# Patient Record
Sex: Male | Born: 1940 | Race: White | Hispanic: No | Marital: Married | Smoking: Current every day smoker
Health system: Southern US, Community
[De-identification: ages and names within clinical notes are randomized; demographics above are authoritative.]

## PROBLEM LIST (undated history)

## (undated) DIAGNOSIS — J449 Chronic obstructive pulmonary disease, unspecified: Secondary | ICD-10-CM

## (undated) DIAGNOSIS — E119 Type 2 diabetes mellitus without complications: Secondary | ICD-10-CM

## (undated) DIAGNOSIS — G8929 Other chronic pain: Secondary | ICD-10-CM

## (undated) DIAGNOSIS — J439 Emphysema, unspecified: Secondary | ICD-10-CM

## (undated) HISTORY — PX: REPLACEMENT TOTAL KNEE: SUR1224

---

## 2003-02-08 ENCOUNTER — Other Ambulatory Visit: Payer: Self-pay

## 2005-01-21 ENCOUNTER — Ambulatory Visit: Payer: Self-pay | Admitting: Nurse Practitioner

## 2005-01-28 ENCOUNTER — Ambulatory Visit: Payer: Self-pay | Admitting: *Deleted

## 2005-02-14 ENCOUNTER — Ambulatory Visit: Payer: Self-pay | Admitting: Orthopedic Surgery

## 2006-03-04 ENCOUNTER — Ambulatory Visit: Payer: Self-pay | Admitting: *Deleted

## 2006-03-18 ENCOUNTER — Ambulatory Visit: Payer: Self-pay | Admitting: *Deleted

## 2007-09-19 IMAGING — CR DG CHEST 2V
1 series · 3 of 3 positions shown · non-contrast
Comparison: none

REASON FOR EXAM: xray chest copd call report [id number]
COMMENTS:

[Series 1: view not recorded · 0.17mm/px · 3 of 3 slices shown]
[im 1/3]
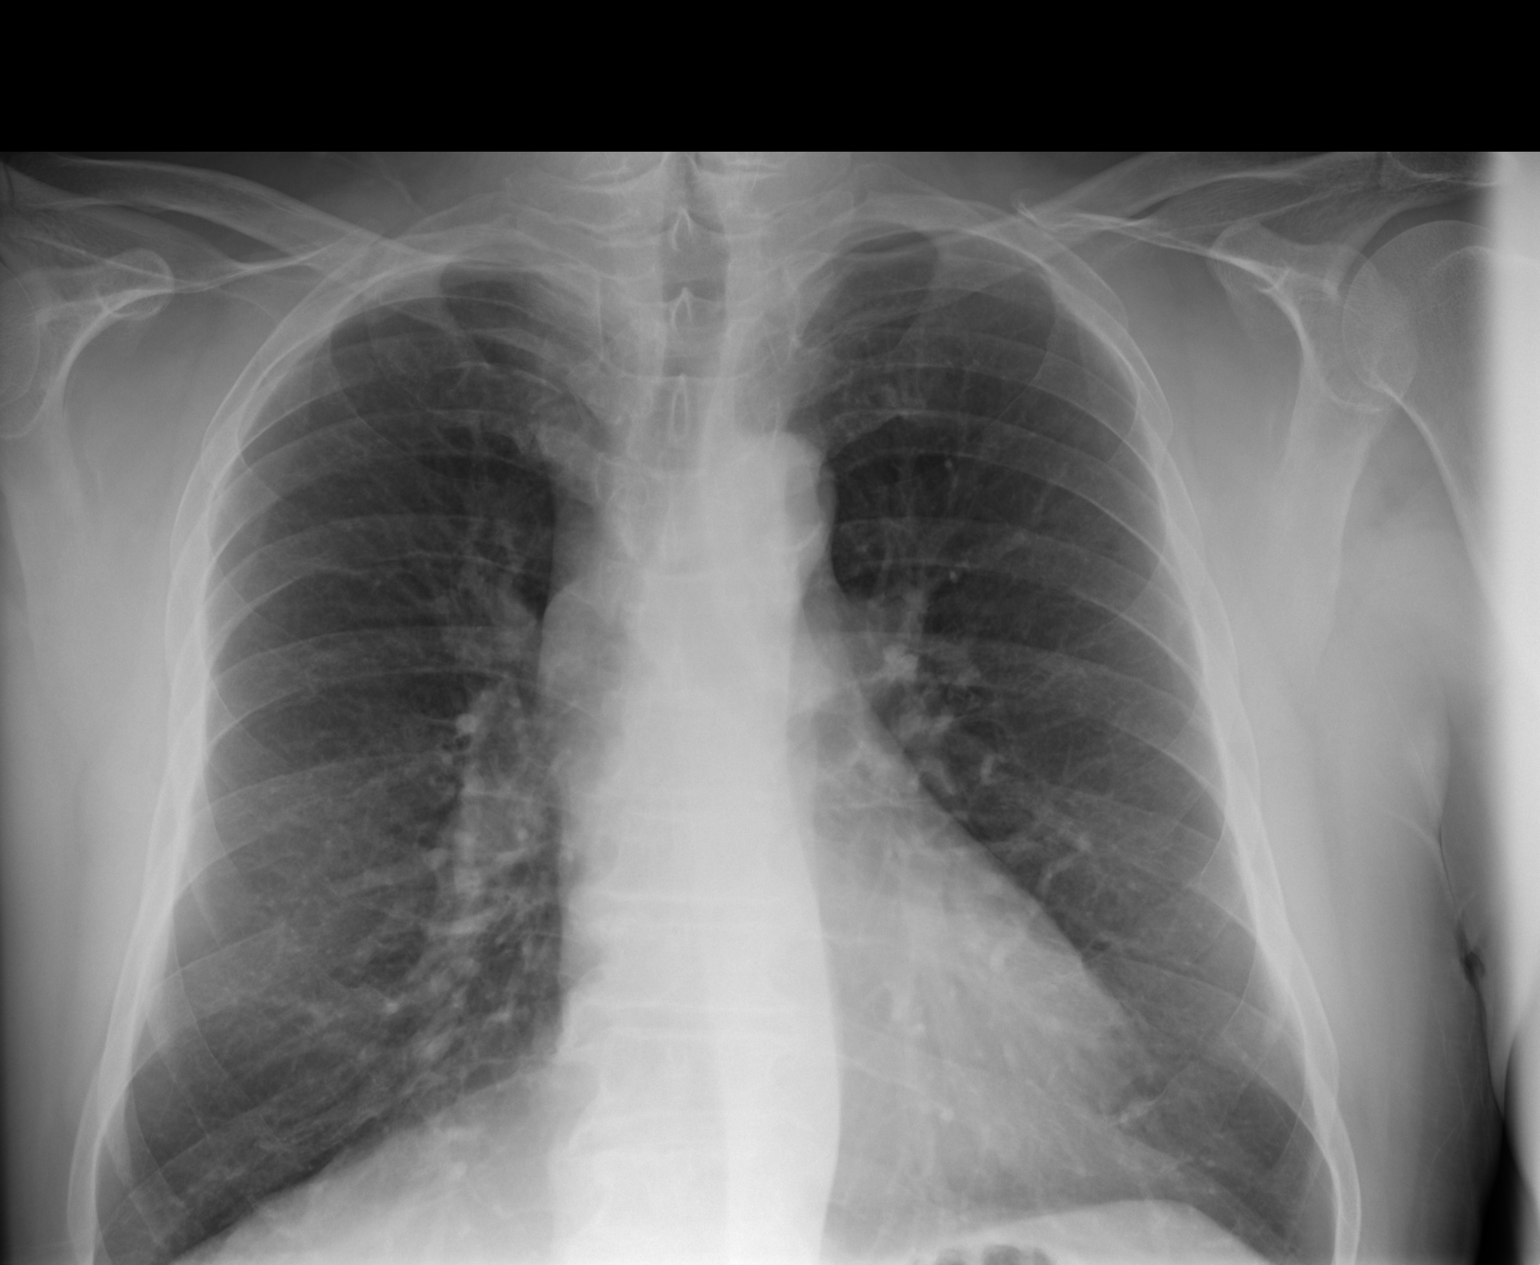
[im 2/3]
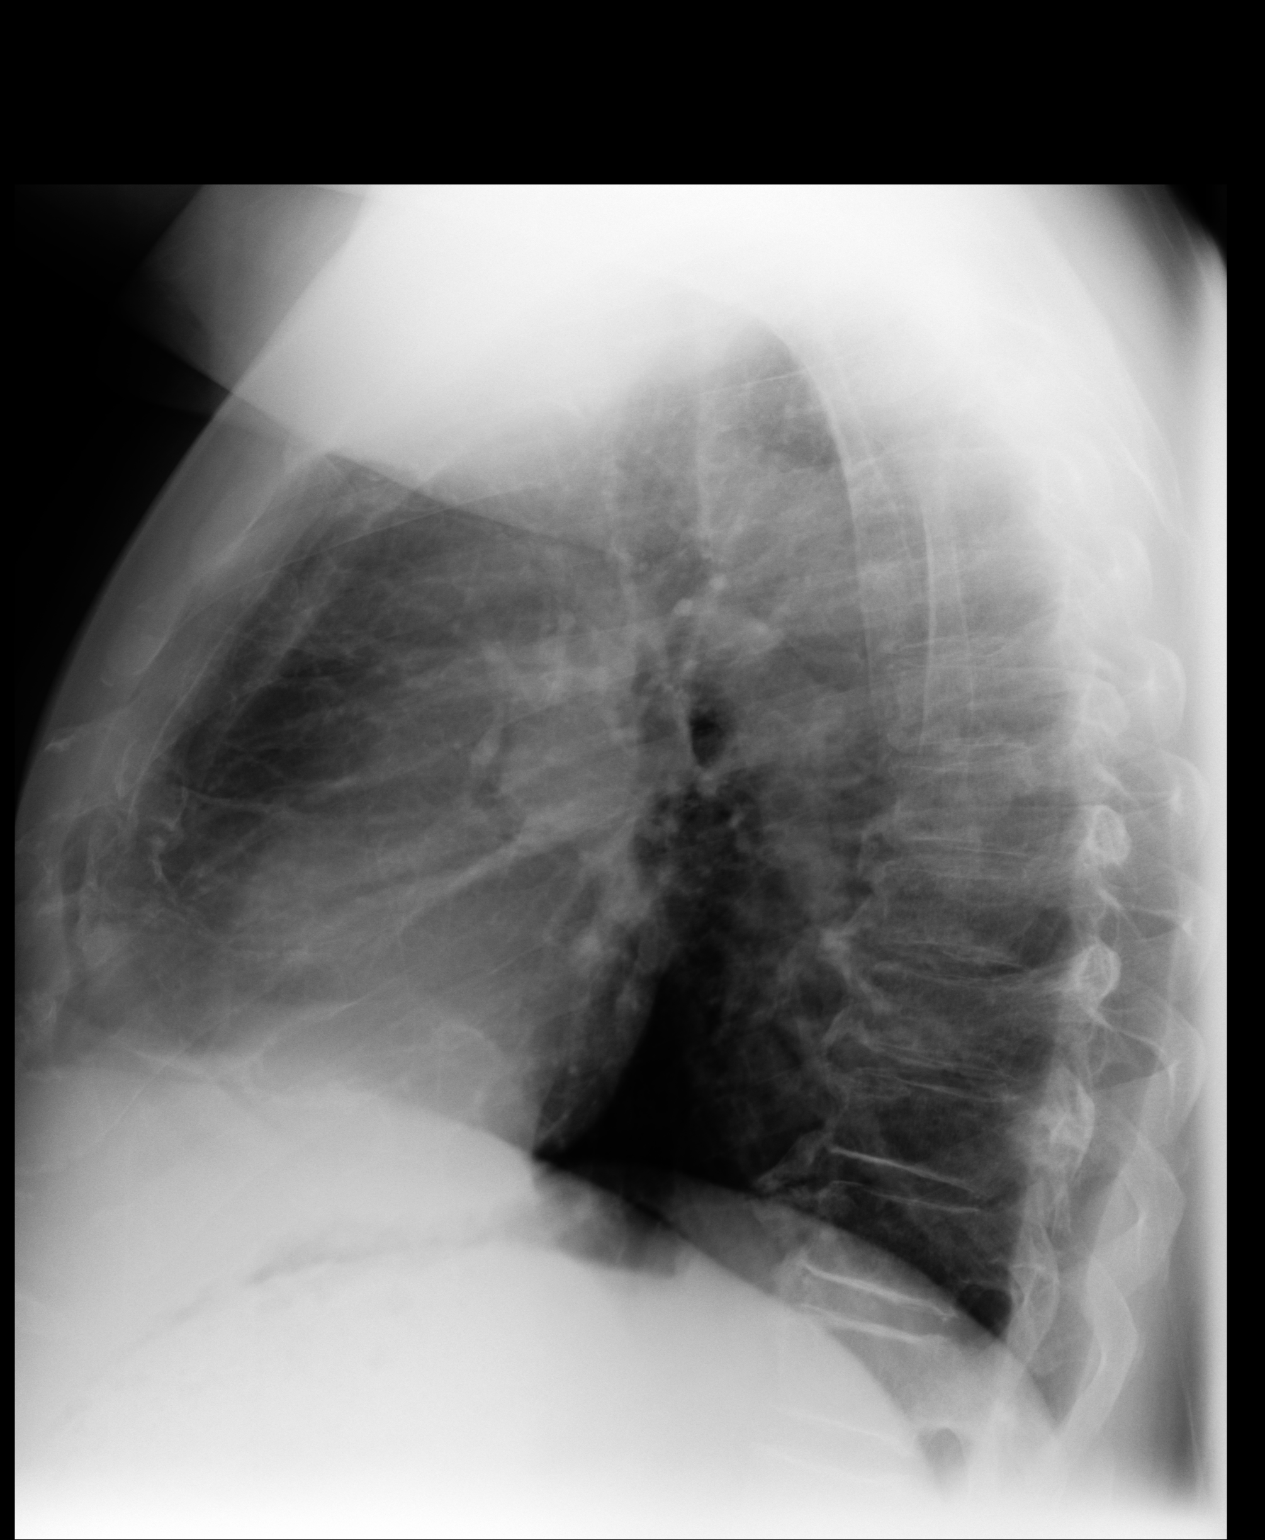
[im 3/3]
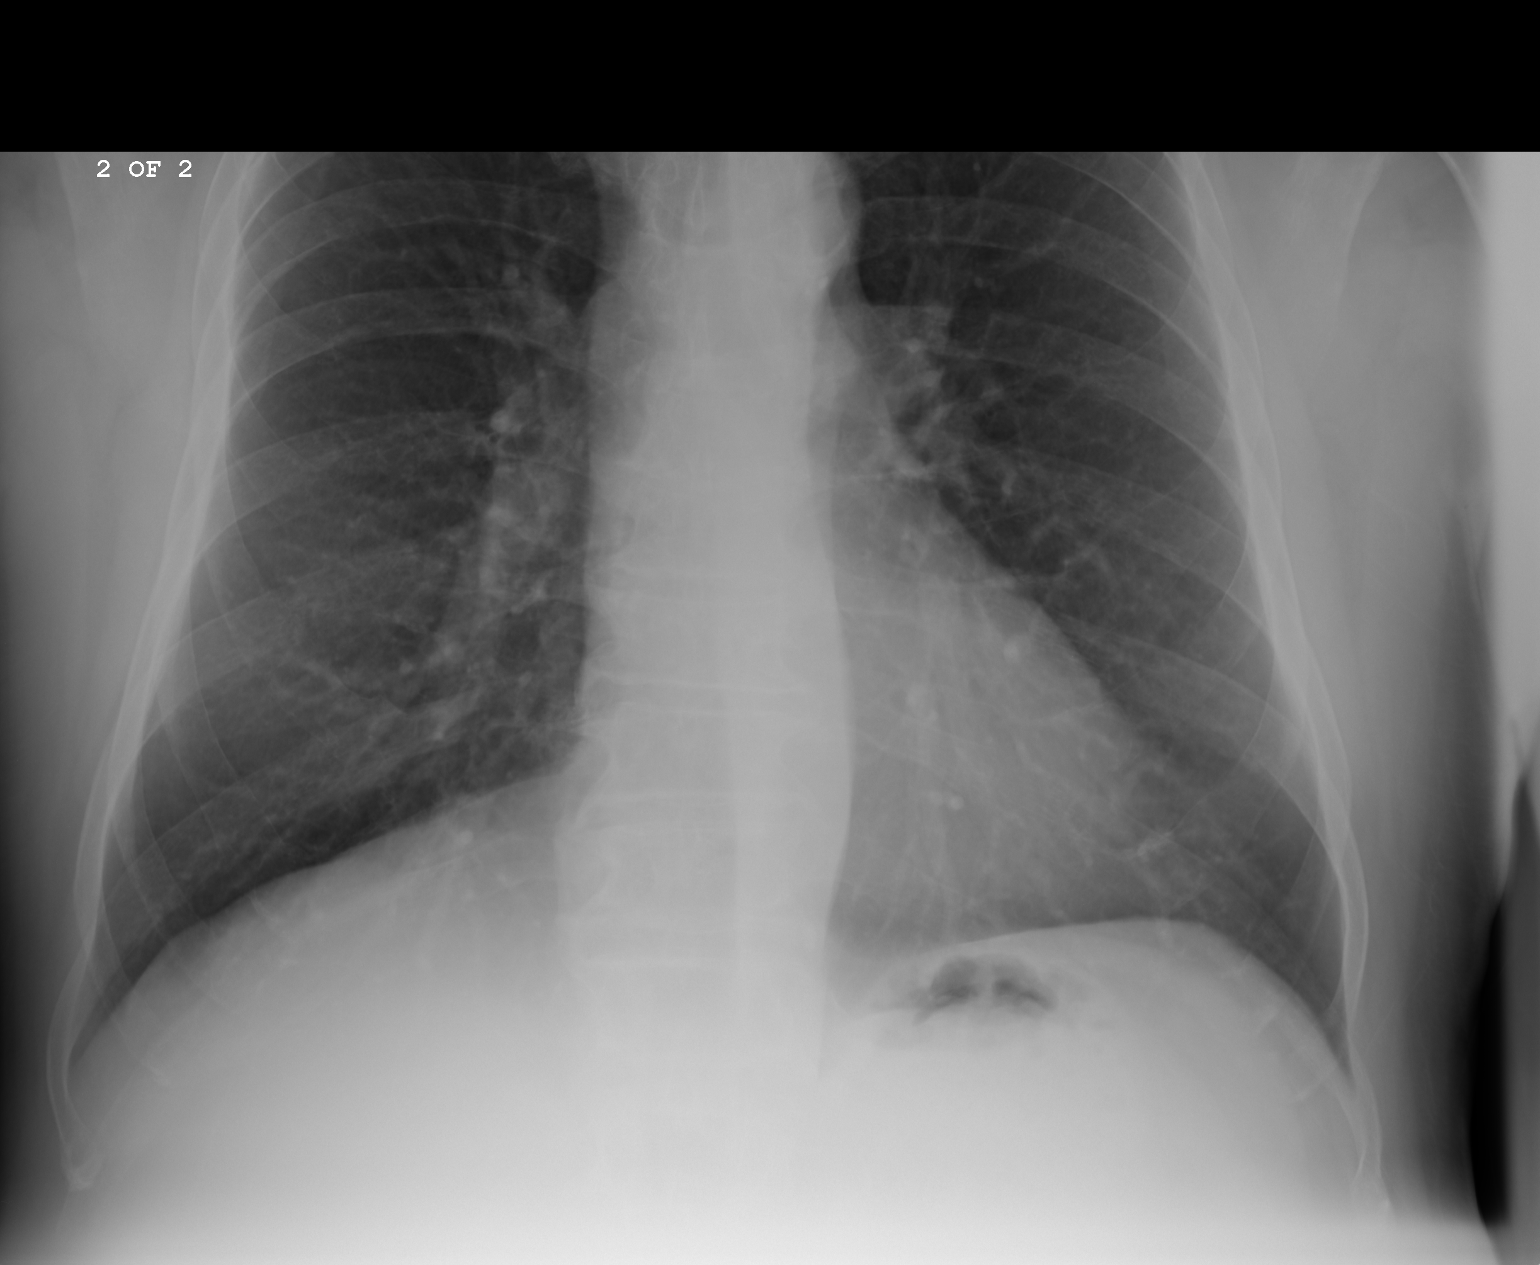

[3 of 3 positions shown; findings below may reference images not displayed]

PROCEDURE:     DXR - DXR CHEST PA (OR AP) AND LATERAL  - March 18, 2006  [DATE]

RESULT:     Comparison is made to study 28 January, 2005.

The lungs are adequately inflated. There is no focal infiltrate. The heart
is not enlarged and the pulmonary vascularity is not engorged. There are
mild degenerative changes of the thoracic spine.
IMPRESSION: There is mild hyperinflation consistent with COPD. This is
stable. I see no evidence of CHF or pneumonia.

## 2007-11-21 ENCOUNTER — Ambulatory Visit: Payer: Self-pay | Admitting: Family Medicine

## 2008-09-20 ENCOUNTER — Ambulatory Visit: Payer: Self-pay | Admitting: Gastroenterology

## 2011-03-18 ENCOUNTER — Emergency Department: Payer: Self-pay

## 2011-03-18 LAB — HEPATIC FUNCTION PANEL A (ARMC)
Albumin: 3.7 g/dL (ref 3.4–5.0)
Alkaline Phosphatase: 74 U/L (ref 50–136)
Bilirubin, Direct: 0.1 mg/dL (ref 0.00–0.20)
SGOT(AST): 17 U/L (ref 15–37)
SGPT (ALT): 23 U/L

## 2011-03-18 LAB — BASIC METABOLIC PANEL
Anion Gap: 7 (ref 7–16)
BUN: 18 mg/dL (ref 7–18)
Chloride: 96 mmol/L — ABNORMAL LOW (ref 98–107)
Creatinine: 1.02 mg/dL (ref 0.60–1.30)
Osmolality: 276 (ref 275–301)

## 2011-03-18 LAB — CBC
HGB: 12.8 g/dL — ABNORMAL LOW (ref 13.0–18.0)
MCH: 29.2 pg (ref 26.0–34.0)
MCHC: 32.1 g/dL (ref 32.0–36.0)
Platelet: 143 10*3/uL — ABNORMAL LOW (ref 150–440)
RDW: 14.2 % (ref 11.5–14.5)
WBC: 7 10*3/uL (ref 3.8–10.6)

## 2011-03-18 LAB — TROPONIN I: Troponin-I: <0.02 ng/mL

## 2013-09-22 ENCOUNTER — Emergency Department: Payer: Self-pay | Admitting: Emergency Medicine

## 2013-09-22 LAB — COMPREHENSIVE METABOLIC PANEL
ALK PHOS: 81 U/L
Albumin: 3.2 g/dL — ABNORMAL LOW (ref 3.4–5.0)
Anion Gap: 3 — ABNORMAL LOW (ref 7–16)
BUN: 14 mg/dL (ref 7–18)
Bilirubin,Total: 0.3 mg/dL (ref 0.2–1.0)
CALCIUM: 8.6 mg/dL (ref 8.5–10.1)
CHLORIDE: 99 mmol/L (ref 98–107)
CO2: 37 mmol/L — AB (ref 21–32)
CREATININE: 1.21 mg/dL (ref 0.60–1.30)
EGFR (African American): >60
GFR CALC NON AF AMER: 59 — AB
Glucose: 162 mg/dL — ABNORMAL HIGH (ref 65–99)
OSMOLALITY: 282 (ref 275–301)
Potassium: 4.5 mmol/L (ref 3.5–5.1)
SGOT(AST): 19 U/L (ref 15–37)
SGPT (ALT): 24 U/L
SODIUM: 139 mmol/L (ref 136–145)
TOTAL PROTEIN: 7.3 g/dL (ref 6.4–8.2)

## 2013-09-22 LAB — CBC
HCT: 33.2 % — ABNORMAL LOW (ref 40.0–52.0)
HGB: 10.1 g/dL — ABNORMAL LOW (ref 13.0–18.0)
MCH: 26.4 pg (ref 26.0–34.0)
MCHC: 30.5 g/dL — ABNORMAL LOW (ref 32.0–36.0)
MCV: 86 fL (ref 80–100)
Platelet: 153 10*3/uL (ref 150–440)
RBC: 3.84 10*6/uL — AB (ref 4.40–5.90)
RDW: 15.8 % — ABNORMAL HIGH (ref 11.5–14.5)
WBC: 6.9 10*3/uL (ref 3.8–10.6)

## 2013-09-22 LAB — TROPONIN I: Troponin-I: <0.02 ng/mL

## 2015-12-31 ENCOUNTER — Encounter: Payer: Medicare Other | Attending: Pulmonary Disease | Admitting: Respiratory Therapy

## 2015-12-31 VITALS — Ht 69.5 in | Wt 209.1 lb

## 2015-12-31 DIAGNOSIS — J432 Centrilobular emphysema: Secondary | ICD-10-CM | POA: Insufficient documentation

## 2015-12-31 NOTE — Patient Instructions (Signed)
Patient Instructions  Patient Details  Name: Jeremy PlanasJackie N Cadena Sr. MRN: [id number] Date of Birth: [birth year] Referring Provider:  Cathleen Cortihang, Lydia H, MD  Below are the personal goals you chose as well as exercise and nutrition goals. Our goal is to help you keep on track towards obtaining and maintaining your goals. We will be discussing your progress on these goals with you throughout the program.  Initial Exercise Prescription:     Initial Exercise Prescription - 12/31/15 1100      Date of Initial Exercise RX and Referring Provider   Date 12/31/15   Referring Provider Cherly Hensenhang     Oxygen   Oxygen Continuous   Liters 2     NuStep   Level 2   Minutes 15   METs 2     Arm Ergometer   Level 1   Minutes 15   METs 2     REL-XR   Level 2   Minutes 15   METs 2     T5 Nustep   Level 1   Minutes 15   METs 2     Biostep-RELP   Level 2   Minutes 15   METs 2     Intensity   THRR 40-80% of Max Heartrate 117-136   Ratings of Perceived Exertion 11-13   Perceived Dyspnea 0-4     Progression   Progression Continue to progress workloads to maintain intensity without signs/symptoms of physical distress.     Resistance Training   Training Prescription Yes   Weight 3   Reps 10-12      Exercise Goals: Frequency: Be able to perform aerobic exercise three times per week working toward 3-5 days per week.  Intensity: Work with a perceived exertion of 11 (fairly light) - 15 (hard) as tolerated. Follow your new exercise prescription and watch for changes in prescription as you progress with the program. Changes will be reviewed with you when they are made.  Duration: You should be able to do 30 minutes of continuous aerobic exercise in addition to a 5 minute warm-up and a 5 minute cool-down routine.  Nutrition Goals: Your personal nutrition goals will be established when you do your nutrition analysis with the dietician.  The following are nutrition guidelines to  follow: Cholesterol < 200mg /day Sodium < 1500mg /day Fiber: Men over 50 yrs - 30 grams per day  Personal Goals:     Personal Goals and Risk Factors at Admission - 12/31/15 1333      Core Components/Risk Factors/Patient Goals on Admission   Sedentary Yes   Intervention Provide advice, education, support and counseling about physical activity/exercise needs.;Develop an individualized exercise prescription for aerobic and resistive training based on initial evaluation findings, risk stratification, comorbidities and participant's personal goals.   Expected Outcomes Achievement of increased cardiorespiratory fitness and enhanced flexibility, muscular endurance and strength shown through measurements of functional capacity and personal statement of participant.   Increase Strength and Stamina Yes   Intervention Provide advice, education, support and counseling about physical activity/exercise needs.;Develop an individualized exercise prescription for aerobic and resistive training based on initial evaluation findings, risk stratification, comorbidities and participant's personal goals.   Expected Outcomes Achievement of increased cardiorespiratory fitness and enhanced flexibility, muscular endurance and strength shown through measurements of functional capacity and personal statement of participant.   Improve shortness of breath with ADL's Yes   Intervention Provide education, individualized exercise plan and daily activity instruction to help decrease symptoms of SOB with activities of daily living.  Expected Outcomes Short Term: Achieves a reduction of symptoms when performing activities of daily living.   Develop more efficient breathing techniques such as purse lipped breathing and diaphragmatic breathing; and practicing self-pacing with activity Yes   Intervention Provide education, demonstration and support about specific breathing techniuqes utilized for more efficient breathing. Include  techniques such as pursed lipped breathing, diaphragmatic breathing and self-pacing activity.   Expected Outcomes Short Term: Participant will be able to demonstrate and use breathing techniques as needed throughout daily activities.   Increase knowledge of respiratory medications and ability to use respiratory devices properly  Yes   Intervention Provide education and demonstration as needed of appropriate use of medications, inhalers, and oxygen therapy.   Expected Outcomes Short Term: Achieves understanding of medications use. Understands that oxygen is a medication prescribed by physician. Demonstrates appropriate use of inhaler and oxygen therapy.   Diabetes Yes   Intervention Provide education about signs/symptoms and action to take for hypo/hyperglycemia.;Provide education about proper nutrition, including hydration, and aerobic/resistive exercise prescription along with prescribed medications to achieve blood glucose in normal ranges: Fasting glucose 65-99 mg/dL   Expected Outcomes Short Term: Participant verbalizes understanding of the signs/symptoms and immediate care of hyper/hypoglycemia, proper foot care and importance of medication, aerobic/resistive exercise and nutrition plan for blood glucose control.;Long Term: Attainment of HbA1C < 7%.      Tobacco Use Initial Evaluation: History  Smoking Status   Not on file  Smokeless Tobacco   Not on file    Copy of goals given to participant.

## 2015-12-31 NOTE — Progress Notes (Signed)
Pulmonary Individual Treatment Plan  Patient Details  Name: Jeremy PlanasJackie N Omura Sr. MRN: [id number] Date of Birth: 11/11/1940 Referring Provider:   Flowsheet Row Pulmonary [facility]  Referring Provider  Cherly Hensenhang      Initial Encounter Date:  Flowsheet Row [facility]  Date  12/31/15  Referring Provider  Cherly Hensenhang      Visit Diagnosis: Centrilobular emphysema (HCC)  Patient's Home Medications on Admission: No current outpatient prescriptions on file.  Past Medical History: No past medical history on file.  Tobacco Use: History  Smoking Status   Not on file  Smokeless Tobacco   Not on file    Labs: Recent Review Flowsheet Data    There is no flowsheet data to display.       ADL UCSD:     Pulmonary Assessment Scores    Row Name 12/31/15 1330         ADL UCSD   ADL Phase Entry     SOB Score total 93     Rest 3     Walk 5     Stairs 5     Bath 3     Dress 3     Shop 5        Pulmonary Function Assessment:     Pulmonary Function Assessment - 12/31/15 1329      Initial Spirometry Results   FVC% 34 %   FEV1% 26 %   FEV1/FVC Ratio 55.82   Comments Test date: 12/31/15     Post Bronchodilator Spirometry Results   FVC% 42 %   FEV1% 36 %   FEV1/FVC Ratio 56.95     Breath   Bilateral Breath Sounds Decreased;Wheezes;Expiratory   Shortness of Breath Yes;Limiting activity;Fear of Shortness of Breath      Exercise Target Goals: Date: 12/31/15  Exercise Program Goal: Individual exercise prescription set with THRR, safety & activity barriers. Participant demonstrates ability to understand and report RPE using BORG scale, to self-measure pulse accurately, and to acknowledge the importance of the exercise prescription.  Exercise Prescription Goal: Starting with aerobic activity 30 plus minutes a day, 3 days per week for initial exercise prescription.  Provide home exercise prescription and guidelines that participant acknowledges understanding prior to discharge.  Activity Barriers & Risk Stratification:     Activity Barriers & Cardiac Risk Stratification - 12/31/15 1329      Activity Barriers & Cardiac Risk Stratification   Activity Barriers Back Problems;Shortness of Breath;Deconditioning;Muscular Weakness      6 Minute Walk:     6 Minute Walk    Row Name 12/31/15 1146         6 Minute Walk   Distance 355 feet     Walk Time 2.5 minutes     # of Rest Breaks 1     MPH 1.6     METS 2.23     RPE 13     Perceived Dyspnea  5     VO2 Peak 4.36     Symptoms Yes (comment)     Comments stopped due to back pain 8 out of 10     Resting HR 99 bpm     Resting BP 130/72     Max Ex. HR 116 bpm     Max Ex. BP 154/58     2 Minute Post BP 118/58       Interval HR   Baseline HR 99  1 Minute HR 106     2 Minute HR 116     2 Minute Post HR 95     Interval Heart Rate? Yes       Interval Oxygen   Interval Oxygen? Yes     Baseline Oxygen Saturation % 94 %     Baseline Liters of Oxygen 2 L     1 Minute Oxygen Saturation % 92 %     1 Minute Liters of Oxygen 2 L     2 Minute Oxygen Saturation % 92 %     2 Minute Liters of Oxygen 2 L     2 Minute Post Oxygen Saturation % 94 %     2 Minute Post Liters of Oxygen 2 L        Initial Exercise Prescription:     Initial Exercise Prescription - 12/31/15 1100      Date of Initial Exercise RX and Referring Provider   Date 12/31/15   Referring Provider Cherly Hensenhang     Oxygen   Oxygen Continuous   Liters 2     NuStep   Level 2   Minutes 15   METs 2     Arm Ergometer   Level 1   Minutes 15   METs 2     REL-XR   Level 2   Minutes 15   METs 2     T5 Nustep   Level 1   Minutes 15   METs 2     Biostep-RELP   Level 2   Minutes 15   METs 2     Intensity   THRR 40-80% of Max Heartrate 117-136   Ratings of Perceived Exertion 11-13   Perceived Dyspnea 0-4      Progression   Progression Continue to progress workloads to maintain intensity without signs/symptoms of physical distress.     Resistance Training   Training Prescription Yes   Weight 3   Reps 10-12      Perform Capillary Blood Glucose checks as needed.  Exercise Prescription Changes:   Exercise Comments:   Discharge Exercise Prescription (Final Exercise Prescription Changes):    Nutrition:  Target Goals: Understanding of nutrition guidelines, daily intake of sodium 1500mg , cholesterol 200mg , calories 30% from fat and 7% or less from saturated fats, daily to have 5 or more servings of fruits and vegetables.  Biometrics:     Pre Biometrics - 12/31/15 1145      Pre Biometrics   Height 5' 9.5" (1.765 m)   Weight 209 lb 1.6 oz (94.8 kg)   Waist Circumference 46.25 inches   Hip Circumference 41.75 inches   Waist to Hip Ratio 1.11 %   BMI (Calculated) 30.5       Nutrition Therapy Plan and Nutrition Goals:   Nutrition Discharge: Rate Your Plate Scores:   Psychosocial: Target Goals: Acknowledge presence or absence of depression, maximize coping skills, provide positive support system. Participant is able to verbalize types and ability to use techniques and skills needed for reducing stress and depression.  Initial Review & Psychosocial Screening:     Initial Psych Review & Screening - 12/31/15 1334      Family Dynamics   Good Support System? Yes   Comments Mr Manson PasseyBrown has a very supportive family, including his wife and 3 children. His children live close and help with many chores at Mr Brown's home. He is looking forward to more activity and possibly return to fishing and hunting.  Barriers   Psychosocial barriers to participate in program The patient should benefit from training in stress management and relaxation.     Screening Interventions   Interventions Encouraged to exercise;Program counselor consult      Quality of Life Scores:     Quality of  Life - 12/31/15 1335      Quality of Life Scores   Health/Function Pre 19.63 %   Socioeconomic Pre 19.13 %   Psych/Spiritual Pre 20.67 %   Family Pre 21 %   GLOBAL Pre 19.89 %      PHQ-9: Recent Review Flowsheet Data    Depression screen [facility] 2/9 12/31/2015   Decreased Interest 1   Down, Depressed, Hopeless 1   PHQ - 2 Score 2   Altered sleeping 3   Tired, decreased energy 3   Change in appetite 1   Feeling bad or failure about yourself  0   Trouble concentrating 1   Moving slowly or fidgety/restless 0   Suicidal thoughts 0   PHQ-9 Score 10   Difficult doing work/chores Very difficult      Psychosocial Evaluation and Intervention:   Psychosocial Re-Evaluation:  Education: Education Goals: Education classes will be provided on a weekly basis, covering required topics. Participant will state understanding/return demonstration of topics presented.  Learning Barriers/Preferences:     Learning Barriers/Preferences - 12/31/15 1329      Learning Barriers/Preferences   Learning Barriers None   Learning Preferences None      Education Topics: Initial Evaluation Education: - Verbal, written and demonstration of respiratory meds, RPE/PD scales, oximetry and breathing techniques. Instruction on use of nebulizers and MDIs: cleaning and proper use, rinsing mouth with steroid doses and importance of monitoring MDI activations. Flowsheet Row Pulmonary Rehab from 12/31/2015 in [facility] Cardiac and Pulmonary Rehab  Date  12/31/15  Educator  LB  Instruction Review Code  2- meets goals/outcomes      General Nutrition Guidelines/Fats and Fiber: -Group instruction provided by verbal, written material, models and posters to present the general guidelines for heart healthy nutrition. Gives an explanation and review of dietary fats and fiber.   Controlling Sodium/Reading Food Labels: -Group verbal and written material supporting the discussion of sodium use in heart healthy  nutrition. Review and explanation with models, verbal and written materials for utilization of the food label.   Exercise Physiology & Risk Factors: - Group verbal and written instruction with models to review the exercise physiology of the cardiovascular system and associated critical values. Details cardiovascular disease risk factors and the goals associated with each risk factor.   Aerobic Exercise & Resistance Training: - Gives group verbal and written discussion on the health impact of inactivity. On the components of aerobic and resistive training programs and the benefits of this training and how to safely progress through these programs.   Flexibility, Balance, General Exercise Guidelines: - Provides group verbal and written instruction on the benefits of flexibility and balance training programs. Provides general exercise guidelines with specific guidelines to those with heart or lung disease. Demonstration and skill practice provided.   Stress Management: - Provides group verbal and written instruction about the health risks of elevated stress, cause of high stress, and healthy ways to reduce stress.   Depression: - Provides group verbal and written instruction on the correlation between heart/lung disease and depressed mood, treatment options, and the stigmas associated with seeking treatment.   Exercise & Equipment Safety: - Individual verbal instruction and demonstration of equipment use and safety  with use of the equipment.   Infection Prevention: - Provides verbal and written material to individual with discussion of infection control including proper hand washing and proper equipment cleaning during exercise session. Flowsheet Row Pulmonary Rehab from 12/31/2015 in [facility] Cardiac and Pulmonary Rehab  Date  12/31/15  Educator  LB  Instruction Review Code  2- meets goals/outcomes      Falls Prevention: - Provides verbal and written material to individual with  discussion of falls prevention and safety. Flowsheet Row Pulmonary Rehab from 12/31/2015 in [facility] Cardiac and Pulmonary Rehab  Date  12/31/15  Educator  LB  Instruction Review Code  2- meets goals/outcomes      Diabetes: - Individual verbal and written instruction to review signs/symptoms of diabetes, desired ranges of glucose level fasting, after meals and with exercise. Advice that pre and post exercise glucose checks will be done for 3 sessions at entry of program.   Chronic Lung Diseases: - Group verbal and written instruction to review new updates, new respiratory medications, new advancements in procedures and treatments. Provide informative websites and "800" numbers of self-education.   Lung Procedures: - Group verbal and written instruction to describe testing methods done to diagnose lung disease. Review the outcome of test results. Describe the treatment choices: Pulmonary Function Tests, ABGs and oximetry.   Energy Conservation: - Provide group verbal and written instruction for methods to conserve energy, plan and organize activities. Instruct on pacing techniques, use of adaptive equipment and posture/positioning to relieve shortness of breath.   Triggers: - Group verbal and written instruction to review types of environmental controls: home humidity, furnaces, filters, dust mite/pet prevention, HEPA vacuums. To discuss weather changes, air quality and the benefits of nasal washing.   Exacerbations: - Group verbal and written instruction to provide: warning signs, infection symptoms, calling MD promptly, preventive modes, and value of vaccinations. Review: effective airway clearance, coughing and/or vibration techniques. Create an Sport and exercise psychologist.   Oxygen: - Individual and group verbal and written instruction on oxygen therapy. Includes supplement oxygen, available portable oxygen systems, continuous and intermittent flow rates, oxygen safety, concentrators, and Medicare  reimbursement for oxygen. Flowsheet Row Pulmonary Rehab from 12/31/2015 in [facility] Cardiac and Pulmonary Rehab  Date  12/31/15  Educator  LB  Instruction Review Code  2- meets goals/outcomes      Respiratory Medications: - Group verbal and written instruction to review medications for lung disease. Drug class, frequency, complications, importance of spacers, rinsing mouth after steroid MDI's, and proper cleaning methods for nebulizers. Flowsheet Row Pulmonary Rehab from 12/31/2015 in [facility] Cardiac and Pulmonary Rehab  Date  12/31/15  Educator  LB  Instruction Review Code  2- meets goals/outcomes      AED/CPR: - Group verbal and written instruction with the use of models to demonstrate the basic use of the AED with the basic ABC's of resuscitation.   Breathing Retraining: - Provides individuals verbal and written instruction on purpose, frequency, and proper technique of diaphragmatic breathing and pursed-lipped breathing. Applies individual practice skills. Flowsheet Row Pulmonary Rehab from 12/31/2015 in [facility] Cardiac and Pulmonary Rehab  Date  12/31/15  Educator  LB  Instruction Review Code  2- meets goals/outcomes      Anatomy and Physiology of the Lungs: - Group verbal and written instruction with the use of models to provide basic lung anatomy and physiology related to function, structure and complications of lung disease.   Heart Failure: - Group verbal and written instruction on the basics of heart failure:  signs/symptoms, treatments, explanation of ejection fraction, enlarged heart and cardiomyopathy.   Sleep Apnea: - Individual verbal and written instruction to review Obstructive Sleep Apnea. Review of risk factors, methods for diagnosing and types of masks and machines for OSA.   Anxiety: - Provides group, verbal and written instruction on the correlation between heart/lung disease and anxiety, treatment options, and management of anxiety.   Relaxation: - Provides  group, verbal and written instruction about the benefits of relaxation for patients with heart/lung disease. Also provides patients with examples of relaxation techniques.   Knowledge Questionnaire Score:     Knowledge Questionnaire Score - 12/31/15 1329      Knowledge Questionnaire Score   Pre Score 5/10       Core Components/Risk Factors/Patient Goals at Admission:     Personal Goals and Risk Factors at Admission - 12/31/15 1333      Core Components/Risk Factors/Patient Goals on Admission   Sedentary Yes   Intervention Provide advice, education, support and counseling about physical activity/exercise needs.;Develop an individualized exercise prescription for aerobic and resistive training based on initial evaluation findings, risk stratification, comorbidities and participant's personal goals.   Expected Outcomes Achievement of increased cardiorespiratory fitness and enhanced flexibility, muscular endurance and strength shown through measurements of functional capacity and personal statement of participant.   Increase Strength and Stamina Yes   Intervention Provide advice, education, support and counseling about physical activity/exercise needs.;Develop an individualized exercise prescription for aerobic and resistive training based on initial evaluation findings, risk stratification, comorbidities and participant's personal goals.   Expected Outcomes Achievement of increased cardiorespiratory fitness and enhanced flexibility, muscular endurance and strength shown through measurements of functional capacity and personal statement of participant.   Improve shortness of breath with ADL's Yes   Intervention Provide education, individualized exercise plan and daily activity instruction to help decrease symptoms of SOB with activities of daily living.   Expected Outcomes Short Term: Achieves a reduction of symptoms when performing activities of daily living.   Develop more efficient  breathing techniques such as purse lipped breathing and diaphragmatic breathing; and practicing self-pacing with activity Yes   Intervention Provide education, demonstration and support about specific breathing techniuqes utilized for more efficient breathing. Include techniques such as pursed lipped breathing, diaphragmatic breathing and self-pacing activity.   Expected Outcomes Short Term: Participant will be able to demonstrate and use breathing techniques as needed throughout daily activities.   Increase knowledge of respiratory medications and ability to use respiratory devices properly  Yes   Intervention Provide education and demonstration as needed of appropriate use of medications, inhalers, and oxygen therapy.   Expected Outcomes Short Term: Achieves understanding of medications use. Understands that oxygen is a medication prescribed by physician. Demonstrates appropriate use of inhaler and oxygen therapy.   Diabetes Yes   Intervention Provide education about signs/symptoms and action to take for hypo/hyperglycemia.;Provide education about proper nutrition, including hydration, and aerobic/resistive exercise prescription along with prescribed medications to achieve blood glucose in normal ranges: Fasting glucose 65-99 mg/dL   Expected Outcomes Short Term: Participant verbalizes understanding of the signs/symptoms and immediate care of hyper/hypoglycemia, proper foot care and importance of medication, aerobic/resistive exercise and nutrition plan for blood glucose control.;Long Term: Attainment of HbA1C < 7%.      Core Components/Risk Factors/Patient Goals Review:    Core Components/Risk Factors/Patient Goals at Discharge (Final Review):    ITP Comments:   Comments:  Mr Micalizzi will start [facility] on 01/06/16 and plans to attend 2-3 days/week.

## 2016-01-06 ENCOUNTER — Encounter: Payer: Medicare Other | Attending: Pulmonary Disease

## 2016-01-06 DIAGNOSIS — J432 Centrilobular emphysema: Secondary | ICD-10-CM | POA: Diagnosis not present

## 2016-01-06 LAB — GLUCOSE, CAPILLARY: GLUCOSE-CAPILLARY: 247 mg/dL — AB (ref 65–99)

## 2016-01-06 NOTE — Progress Notes (Signed)
Daily Session Note  Patient Details  Name: Jeremy WALTMAN Sr. MRN: [id number] Date of Birth: August 02, 1940 Referring Provider:   Flowsheet Row Pulmonary Rehab from 12/31/2015 in [facility] Card[facility] and [facility] Re[facility]  Referring Provider  Radene Knee      Encounter Date: 01/06/2016  Check In:     Session Check In - 01/06/16 1435      Check-In   Location [facility]-Cardiac & Pulmonary Rehab   Staff Present Carson Myrtle, BS, RRT, Respiratory Therapist;Kelly Amedeo Plenty, BS, ACSM CEP, Exercise Physiologist;Charmian Forbis Oletta Darter, BA, ACSM CEP, Exercise Physiologist   Supervising physician immediately available to respond to emergencies [facility] immediately available ER MD   Physician(s) Jimmye Norman and Mariea Clonts   Medication changes reported     No   Fall or balance concerns reported    No   Warm-up and Cool-down Performed as group-led instruction   Resistance Training Performed Yes   VAD Patient? No     Pain Assessment   Currently in Pain? No/denies           Exercise Prescription Changes - 01/06/16 1400      Response to Exercise   Blood Pressure (Admit) 110/60   Blood Pressure (Exercise) 128/74   Blood Pressure (Exit) 100/60   Heart Rate (Admit) 98 bpm   Heart Rate (Exercise) 103 bpm   Heart Rate (Exit) 94 bpm   Oxygen Saturation (Admit) 90 %   Oxygen Saturation (Exercise) 95 %   Oxygen Saturation (Exit) 97 %   Rating of Perceived Exertion (Exercise) 13   Perceived Dyspnea (Exercise) 4   Symptoms fatigue   Comments --  Omitted last exercise due to fatigue   Duration Progress to 45 minutes of aerobic exercise without signs/symptoms of physical distress   Intensity THRR unchanged     Progression   Progression Continue to progress workloads to maintain intensity without signs/symptoms of physical distress.     Resistance Training   Training Prescription Yes   Weight 3   Reps 10-12     Interval Training   Interval Training No     Oxygen   Oxygen Continuous   Liters 2     Arm Ergometer   Level 1   Minutes 15  5/5   METs 1.7     T5 Nustep   Level 2   Minutes 3      Goals Met:  Proper associated with RPD/PD & O2 Sat Strength training completed today  Goals Unmet:  Not Applicable  Comments: [name] left early due to fatigue.  His vitals during exercise were within normal limits.  He is very deconditioned and staff will continue to work with him to increase endurance.   Dr. Emily Filbert is Medical Director for [facility] and [facility].

## 2016-01-08 ENCOUNTER — Encounter: Payer: Medicare Other | Admitting: *Deleted

## 2016-01-08 DIAGNOSIS — J432 Centrilobular emphysema: Secondary | ICD-10-CM

## 2016-01-08 LAB — GLUCOSE, CAPILLARY: GLUCOSE-CAPILLARY: 204 mg/dL — AB (ref 65–99)

## 2016-01-08 NOTE — Progress Notes (Signed)
Incomplete Session Note  Patient Details  Name: Jeremy PlanasJackie N Reth Sr. MRN: [id number] Date of Birth: [birth year] Referring Provider:   [facility]  Referring Provider  Durward Forteshang      Jeremy N Grygiel Sr. did not complete his rehab session.  Pt arrived complaining of on going nausea on a daily basis.  His blood sugar was 205.  After talking with RRT, [name] decided to go home and call the doctor today.

## 2016-01-08 NOTE — Progress Notes (Deleted)
Daily Session Note  Patient Details  Name: Jeremy N Parlow Sr. MRN: [id number] Date of Birth: 08/04/1940 Referring Provider:   Flowsheet Row Pulmonary Rehab from 12/31/2015 in [facility] Cardiac and Pulmonary Rehab  Referring Provider  Chang      Encounter Date: 01/08/2016  Check In:     Session Check In - 01/08/16 1140      Check-In   Location [facility]-Cardiac & Pulmonary Rehab   Staff Present Jessica Hawkins, MA, ACSM RCEP, Exercise Physiologist;Other;Laureen Brown, BS, RRT, Respiratory Therapist   Supervising physician immediately available to respond to emergencies [facility] immediately available ER MD   Physician(s) Drs. McShane and Schaevitz   Medication changes reported     No   Fall or balance concerns reported    No   Warm-up and Cool-down Not performed (comment)  sent home see note   Resistance Training Performed No   VAD Patient? No     Pain Assessment   Currently in Pain? No/denies   Multiple Pain Sites No         Goals Met:  Proper associated with RPD/PD & O2 Sat Independence with exercise equipment Using PLB without cueing & demonstrates good technique Exercise tolerated well No report of cardiac concerns or symptoms Strength training completed today  Goals Unmet:  Not Applicable  Comments: Pt able to follow exercise prescription today without complaint.  Will continue to monitor for progression.    Dr. Mark Miller is Medical Director for [facility] and [facility]. 

## 2016-01-13 DIAGNOSIS — J432 Centrilobular emphysema: Secondary | ICD-10-CM | POA: Diagnosis not present

## 2016-01-13 LAB — GLUCOSE, CAPILLARY
GLUCOSE-CAPILLARY: 186 mg/dL — AB (ref 65–99)
GLUCOSE-CAPILLARY: 216 mg/dL — AB (ref 65–99)

## 2016-01-13 NOTE — Progress Notes (Signed)
Daily Session Note  Patient Details  Name: Jeremy Wells. MRN: [id number] Date of Birth: 03-17-1940 Referring Provider:   Flowsheet Row Pulmonary Rehab from 12/31/2015 in [facility] Card[facility] Pul[facility] Re[facility]  Referring Provider  Radene Knee      Encounter Date: 01/13/2016  Check In:     Session Check In - 01/13/16 1314      Check-In   Location [facility]-Cardiac & Pulmonary Rehab   Staff Present Carson Myrtle, BS, RRT, Respiratory Therapist;Kelly Amedeo Plenty, BS, ACSM CEP, Exercise Physiologist;Fatemah Pourciau Oletta Darter, BA, ACSM CEP, Exercise Physiologist   Supervising physician immediately available to respond to emergencies [facility] immediately available ER MD   Physician(s) Clearnce Hasten and Joni Fears   Medication changes reported     No   Fall or balance concerns reported    No   Warm-up and Cool-down Performed as group-led instruction   Resistance Training Performed Yes   VAD Patient? No     Pain Assessment   Currently in Pain? No/denies         Goals Met:  Proper associated with RPD/PD & O2 Sat Independence with exercise equipment Exercise tolerated well Strength training completed today  Goals Unmet:  Not Applicable  Comments: Pt able to follow exercise prescription today without complaint.  Will continue to monitor for progression.    Dr. Emily Filbert is Medical Director for [facility] and [facility].

## 2016-01-15 ENCOUNTER — Encounter: Payer: Self-pay | Admitting: Respiratory Therapy

## 2016-01-20 ENCOUNTER — Encounter: Payer: Self-pay | Admitting: Respiratory Therapy

## 2016-01-20 ENCOUNTER — Telehealth: Payer: Self-pay | Admitting: Respiratory Therapy

## 2016-01-20 DIAGNOSIS — J432 Centrilobular emphysema: Secondary | ICD-10-CM

## 2016-01-20 NOTE — Progress Notes (Signed)
Pulmonary Individual Treatment Plan  Patient Details  Name: Jeremy PlanasJackie N Scheibe Sr. MRN: [id number] Date of Birth: 21-Apr-1940 Referring Provider:   Flowsheet Row Pulmonary Rehab from 12/31/2015 in [facility] Cardiac and Pulmonary Rehab  Referring Provider  Cherly Hensenhang      Initial Encounter Date:  Flowsheet Row Pulmonary Rehab from 12/31/2015 in [facility] Cardiac and Pulmonary Rehab  Date  12/31/15  Referring Provider  Cherly Hensenhang      Visit Diagnosis: Centrilobular emphysema (HCC)  Patient's Home Medications on Admission: No current outpatient prescriptions on file.  Past Medical History: No past medical history on file.  Tobacco Use: History  Smoking Status   Not on file  Smokeless Tobacco   Not on file    Labs: Recent Review Flowsheet Data    There is no flowsheet data to display.       ADL UCSD:     Pulmonary Assessment Scores    Row Name 12/31/15 1330         ADL UCSD   ADL Phase Entry     SOB Score total 93     Rest 3     Walk 5     Stairs 5     Bath 3     Dress 3     Shop 5        Pulmonary Function Assessment:     Pulmonary Function Assessment - 12/31/15 1329      Initial Spirometry Results   FVC% 34 %   FEV1% 26 %   FEV1/FVC Ratio 55.82   Comments Test date: 12/31/15     Post Bronchodilator Spirometry Results   FVC% 42 %   FEV1% 36 %   FEV1/FVC Ratio 56.95     Breath   Bilateral Breath Sounds Decreased;Wheezes;Expiratory   Shortness of Breath Yes;Limiting activity;Fear of Shortness of Breath      Exercise Target Goals:    Exercise Program Goal: Individual exercise prescription set with THRR, safety & activity barriers. Participant demonstrates ability to understand and report RPE using BORG scale, to self-measure pulse accurately, and to acknowledge the importance of the exercise prescription.  Exercise Prescription Goal: Starting with aerobic activity 30 plus minutes a day, 3 days per week for initial exercise prescription. Provide home  exercise prescription and guidelines that participant acknowledges understanding prior to discharge.  Activity Barriers & Risk Stratification:     Activity Barriers & Cardiac Risk Stratification - 12/31/15 1329      Activity Barriers & Cardiac Risk Stratification   Activity Barriers Back Problems;Shortness of Breath;Deconditioning;Muscular Weakness      6 Minute Walk:     6 Minute Walk    Row Name 12/31/15 1146         6 Minute Walk   Distance 355 feet     Walk Time 2.5 minutes     # of Rest Breaks 1     MPH 1.6     METS 2.23     RPE 13     Perceived Dyspnea  5     VO2 Peak 4.36     Symptoms Yes (comment)     Comments stopped due to back pain 8 out of 10     Resting HR 99 bpm     Resting BP 130/72     Max Ex. HR 116 bpm     Max Ex. BP 154/58     2 Minute Post BP 118/58       Interval HR   Baseline HR 99  1 Minute HR 106     2 Minute HR 116     2 Minute Post HR 95     Interval Heart Rate? Yes       Interval Oxygen   Interval Oxygen? Yes     Baseline Oxygen Saturation % 94 %     Baseline Liters of Oxygen 2 L     1 Minute Oxygen Saturation % 92 %     1 Minute Liters of Oxygen 2 L     2 Minute Oxygen Saturation % 92 %     2 Minute Liters of Oxygen 2 L     2 Minute Post Oxygen Saturation % 94 %     2 Minute Post Liters of Oxygen 2 L        Initial Exercise Prescription:     Initial Exercise Prescription - 12/31/15 1100      Date of Initial Exercise RX and Referring Provider   Date 12/31/15   Referring Provider Cherly Hensenhang     Oxygen   Oxygen Continuous   Liters 2     NuStep   Level 2   Minutes 15   METs 2     Arm Ergometer   Level 1   Minutes 15   METs 2     REL-XR   Level 2   Minutes 15   METs 2     T5 Nustep   Level 1   Minutes 15   METs 2     Biostep-RELP   Level 2   Minutes 15   METs 2     Intensity   THRR 40-80% of Max Heartrate 117-136   Ratings of Perceived Exertion 11-13   Perceived Dyspnea 0-4     Progression    Progression Continue to progress workloads to maintain intensity without signs/symptoms of physical distress.     Resistance Training   Training Prescription Yes   Weight 3   Reps 10-12      Perform Capillary Blood Glucose checks as needed.  Exercise Prescription Changes:     Exercise Prescription Changes    Row Name 01/06/16 1400             Response to Exercise   Blood Pressure (Admit) 110/60       Blood Pressure (Exercise) 128/74       Blood Pressure (Exit) 100/60       Heart Rate (Admit) 98 bpm       Heart Rate (Exercise) 103 bpm       Heart Rate (Exit) 94 bpm       Oxygen Saturation (Admit) 90 %       Oxygen Saturation (Exercise) 95 %       Oxygen Saturation (Exit) 97 %       Rating of Perceived Exertion (Exercise) 13       Perceived Dyspnea (Exercise) 4       Symptoms fatigue       Comments --  Omitted last exercise due to fatigue       Duration Progress to 45 minutes of aerobic exercise without signs/symptoms of physical distress       Intensity THRR unchanged         Progression   Progression Continue to progress workloads to maintain intensity without signs/symptoms of physical distress.         Resistance Training   Training Prescription Yes       Weight 3  Reps 10-12         Interval Training   Interval Training No         Oxygen   Oxygen Continuous       Liters 2         Arm Ergometer   Level 1       Minutes 15  5/5       METs 1.7         T5 Nustep   Level 2       Minutes 3          Exercise Comments:     Exercise Comments    Row Name 01/06/16 1452           Exercise Comments Jeremy Wells left early due to fatigue.  His vitals during exercise were within normal limits.  He is very deconditioned and staff will continue to work with him to increase endurance.          Discharge Exercise Prescription (Final Exercise Prescription Changes):     Exercise Prescription Changes - 01/06/16 1400      Response to Exercise   Blood  Pressure (Admit) 110/60   Blood Pressure (Exercise) 128/74   Blood Pressure (Exit) 100/60   Heart Rate (Admit) 98 bpm   Heart Rate (Exercise) 103 bpm   Heart Rate (Exit) 94 bpm   Oxygen Saturation (Admit) 90 %   Oxygen Saturation (Exercise) 95 %   Oxygen Saturation (Exit) 97 %   Rating of Perceived Exertion (Exercise) 13   Perceived Dyspnea (Exercise) 4   Symptoms fatigue   Comments --  Omitted last exercise due to fatigue   Duration Progress to 45 minutes of aerobic exercise without signs/symptoms of physical distress   Intensity THRR unchanged     Progression   Progression Continue to progress workloads to maintain intensity without signs/symptoms of physical distress.     Resistance Training   Training Prescription Yes   Weight 3   Reps 10-12     Interval Training   Interval Training No     Oxygen   Oxygen Continuous   Liters 2     Arm Ergometer   Level 1   Minutes 15  5/5   METs 1.7     T5 Nustep   Level 2   Minutes 3       Nutrition:  Target Goals: Understanding of nutrition guidelines, daily intake of sodium 1500mg , cholesterol 200mg , calories 30% from fat and 7% or less from saturated fats, daily to have 5 or more servings of fruits and vegetables.  Biometrics:     Pre Biometrics - 12/31/15 1145      Pre Biometrics   Height 5' 9.5" (1.765 m)   Weight 209 lb 1.6 oz (94.8 kg)   Waist Circumference 46.25 inches   Hip Circumference 41.75 inches   Waist to Hip Ratio 1.11 %   BMI (Calculated) 30.5       Nutrition Therapy Plan and Nutrition Goals:   Nutrition Discharge: Rate Your Plate Scores:   Psychosocial: Target Goals: Acknowledge presence or absence of depression, maximize coping skills, provide positive support system. Participant is able to verbalize types and ability to use techniques and skills needed for reducing stress and depression.  Initial Review & Psychosocial Screening:     Initial Psych Review & Screening - 12/31/15 1334       Family Dynamics   Good Support System? Yes   Comments Jeremy Wells has a very supportive  family, including his wife and 3 children. His children live close and help with many chores at Jeremy Wells's home. He is looking forward to more activity and possibly return to fishing and hunting.     Barriers   Psychosocial barriers to participate in program The patient should benefit from training in stress management and relaxation.     Screening Interventions   Interventions Encouraged to exercise;Program counselor consult      Quality of Life Scores:     Quality of Life - 12/31/15 1335      Quality of Life Scores   Health/Function Pre 19.63 %   Socioeconomic Pre 19.13 %   Psych/Spiritual Pre 20.67 %   Family Pre 21 %   GLOBAL Pre 19.89 %      PHQ-9: Recent Review Flowsheet Data    Depression screen [facility] 2/9 12/31/2015   Decreased Interest 1   Down, Depressed, Hopeless 1   PHQ - 2 Score 2   Altered sleeping 3   Tired, decreased energy 3   Change in appetite 1   Feeling bad or failure about yourself  0   Trouble concentrating 1   Moving slowly or fidgety/restless 0   Suicidal thoughts 0   PHQ-9 Score 10   Difficult doing work/chores Very difficult      Psychosocial Evaluation and Intervention:   Psychosocial Re-Evaluation:     Psychosocial Re-Evaluation    Row Name 01/20/16 1502 01/20/16 1503           Psychosocial Re-Evaluation   Comments Jeremy Wells last attended [facility] 01/13/16. I called today to check on him. He is very short of breath and was started on an antibiotic which caused nauseate and started on prednisone. He has an appointment with Dr Cherly Hensen 01/21/16. [place] psychosocial assessment reveals no barriers at this time to participation in Pulmonary Rehab.  He  has good family and friend support that encourages Jeremy Wells to participate in [facility] and progress with His goals.  [place] concerns are monitored, but he  has acknowledge that attending the program has  helped to maintain quality life with improved mobility, self-care, and emotional and financial stability.  Soul is commended for regular attendance and self-motivation to improve His pulmonary disease management. Tandy has been sick for 2 weeks, but after calling him today, he states he is planning to get back to [facility] and to "hold his spot"! He is enjoying the class and knows the benefits of attending [facility].         Education: Education Goals: Education classes will be provided on a weekly basis, covering required topics. Participant will state understanding/return demonstration of topics presented.  Learning Barriers/Preferences:     Learning Barriers/Preferences - 12/31/15 1329      Learning Barriers/Preferences   Learning Barriers None   Learning Preferences None      Education Topics: Initial Evaluation Education: - Verbal, written and demonstration of respiratory meds, RPE/PD scales, oximetry and breathing techniques. Instruction on use of nebulizers and MDIs: cleaning and proper use, rinsing mouth with steroid doses and importance of monitoring MDI activations. Flowsheet Row Pulmonary Rehab from 01/06/2016 in [facility] Cardiac and Pulmonary Rehab  Date  12/31/15  Educator  LB  Instruction Review Code  2- meets goals/outcomes      General Nutrition Guidelines/Fats and Fiber: -Group instruction provided by verbal, written material, models and posters to present the general guidelines for heart healthy nutrition. Gives an explanation and review of dietary fats and fiber.   Controlling  Sodium/Reading Food Labels: -Group verbal and written material supporting the discussion of sodium use in heart healthy nutrition. Review and explanation with models, verbal and written materials for utilization of the food label.   Exercise Physiology & Risk Factors: - Group verbal and written instruction with models to review the exercise physiology of the cardiovascular system and  associated critical values. Details cardiovascular disease risk factors and the goals associated with each risk factor.   Aerobic Exercise & Resistance Training: - Gives group verbal and written discussion on the health impact of inactivity. On the components of aerobic and resistive training programs and the benefits of this training and how to safely progress through these programs.   Flexibility, Balance, General Exercise Guidelines: - Provides group verbal and written instruction on the benefits of flexibility and balance training programs. Provides general exercise guidelines with specific guidelines to those with heart or lung disease. Demonstration and skill practice provided.   Stress Management: - Provides group verbal and written instruction about the health risks of elevated stress, cause of high stress, and healthy ways to reduce stress.   Depression: - Provides group verbal and written instruction on the correlation between heart/lung disease and depressed mood, treatment options, and the stigmas associated with seeking treatment. Flowsheet Row Pulmonary Rehab from 01/06/2016 in [facility] Cardiac and Pulmonary Rehab  Date  01/06/16  Educator  [facility]  Instruction Review Code  2- meets goals/outcomes      Exercise & Equipment Safety: - Individual verbal instruction and demonstration of equipment use and safety with use of the equipment.   Infection Prevention: - Provides verbal and written material to individual with discussion of infection control including proper hand washing and proper equipment cleaning during exercise session. Flowsheet Row Pulmonary Rehab from 01/06/2016 in [facility] Cardiac and Pulmonary Rehab  Date  12/31/15  Educator  LB  Instruction Review Code  2- meets goals/outcomes      Falls Prevention: - Provides verbal and written material to individual with discussion of falls prevention and safety. Flowsheet Row Pulmonary Rehab from 01/06/2016 in [facility] Cardiac and  Pulmonary Rehab  Date  12/31/15  Educator  LB  Instruction Review Code  2- meets goals/outcomes      Diabetes: - Individual verbal and written instruction to review signs/symptoms of diabetes, desired ranges of glucose level fasting, after meals and with exercise. Advice that pre and post exercise glucose checks will be done for 3 sessions at entry of program. Flowsheet Row Pulmonary Rehab from 01/06/2016 in [facility] Cardiac and Pulmonary Rehab  Date  01/06/16  Educator  LB  Instruction Review Code  2- meets goals/outcomes      Chronic Lung Diseases: - Group verbal and written instruction to review new updates, new respiratory medications, new advancements in procedures and treatments. Provide informative websites and "800" numbers of self-education.   Lung Procedures: - Group verbal and written instruction to describe testing methods done to diagnose lung disease. Review the outcome of test results. Describe the treatment choices: Pulmonary Function Tests, ABGs and oximetry.   Energy Conservation: - Provide group verbal and written instruction for methods to conserve energy, plan and organize activities. Instruct on pacing techniques, use of adaptive equipment and posture/positioning to relieve shortness of breath.   Triggers: - Group verbal and written instruction to review types of environmental controls: home humidity, furnaces, filters, dust mite/pet prevention, HEPA vacuums. To discuss weather changes, air quality and the benefits of nasal washing.   Exacerbations: - Group verbal and written instruction  to provide: warning signs, infection symptoms, calling MD promptly, preventive modes, and value of vaccinations. Review: effective airway clearance, coughing and/or vibration techniques. Create an Sport and exercise psychologist.   Oxygen: - Individual and group verbal and written instruction on oxygen therapy. Includes supplement oxygen, available portable oxygen systems, continuous and  intermittent flow rates, oxygen safety, concentrators, and Medicare reimbursement for oxygen. Flowsheet Row Pulmonary Rehab from 01/06/2016 in [facility] Cardiac and Pulmonary Rehab  Date  12/31/15  Educator  LB  Instruction Review Code  2- meets goals/outcomes      Respiratory Medications: - Group verbal and written instruction to review medications for lung disease. Drug class, frequency, complications, importance of spacers, rinsing mouth after steroid MDI's, and proper cleaning methods for nebulizers. Flowsheet Row Pulmonary Rehab from 01/06/2016 in [facility] Cardiac and Pulmonary Rehab  Date  12/31/15  Educator  LB  Instruction Review Code  2- meets goals/outcomes      AED/CPR: - Group verbal and written instruction with the use of models to demonstrate the basic use of the AED with the basic ABC's of resuscitation.   Breathing Retraining: - Provides individuals verbal and written instruction on purpose, frequency, and proper technique of diaphragmatic breathing and pursed-lipped breathing. Applies individual practice skills. Flowsheet Row Pulmonary Rehab from 01/06/2016 in [facility] Cardiac and Pulmonary Rehab  Date  01/30/16  Educator  LB  Instruction Review Code  2- meets goals/outcomes      Anatomy and Physiology of the Lungs: - Group verbal and written instruction with the use of models to provide basic lung anatomy and physiology related to function, structure and complications of lung disease.   Heart Failure: - Group verbal and written instruction on the basics of heart failure: signs/symptoms, treatments, explanation of ejection fraction, enlarged heart and cardiomyopathy.   Sleep Apnea: - Individual verbal and written instruction to review Obstructive Sleep Apnea. Review of risk factors, methods for diagnosing and types of masks and machines for OSA.   Anxiety: - Provides group, verbal and written instruction on the correlation between heart/lung disease and anxiety, treatment  options, and management of anxiety.   Relaxation: - Provides group, verbal and written instruction about the benefits of relaxation for patients with heart/lung disease. Also provides patients with examples of relaxation techniques.   Knowledge Questionnaire Score:     Knowledge Questionnaire Score - 12/31/15 1329      Knowledge Questionnaire Score   Pre Score 5/10       Core Components/Risk Factors/Patient Goals at Admission:     Personal Goals and Risk Factors at Admission - 12/31/15 1333      Core Components/Risk Factors/Patient Goals on Admission   Sedentary Yes   Intervention Provide advice, education, support and counseling about physical activity/exercise needs.;Develop an individualized exercise prescription for aerobic and resistive training based on initial evaluation findings, risk stratification, comorbidities and participant's personal goals.   Expected Outcomes Achievement of increased cardiorespiratory fitness and enhanced flexibility, muscular endurance and strength shown through measurements of functional capacity and personal statement of participant.   Increase Strength and Stamina Yes   Intervention Provide advice, education, support and counseling about physical activity/exercise needs.;Develop an individualized exercise prescription for aerobic and resistive training based on initial evaluation findings, risk stratification, comorbidities and participant's personal goals.   Expected Outcomes Achievement of increased cardiorespiratory fitness and enhanced flexibility, muscular endurance and strength shown through measurements of functional capacity and personal statement of participant.   Improve shortness of breath with ADL's Yes   Intervention Provide education,  individualized exercise plan and daily activity instruction to help decrease symptoms of SOB with activities of daily living.   Expected Outcomes Short Term: Achieves a reduction of symptoms when  performing activities of daily living.   Develop more efficient breathing techniques such as purse lipped breathing and diaphragmatic breathing; and practicing self-pacing with activity Yes   Intervention Provide education, demonstration and support about specific breathing techniuqes utilized for more efficient breathing. Include techniques such as pursed lipped breathing, diaphragmatic breathing and self-pacing activity.   Expected Outcomes Short Term: Participant will be able to demonstrate and use breathing techniques as needed throughout daily activities.   Increase knowledge of respiratory medications and ability to use respiratory devices properly  Yes   Intervention Provide education and demonstration as needed of appropriate use of medications, inhalers, and oxygen therapy.   Expected Outcomes Short Term: Achieves understanding of medications use. Understands that oxygen is a medication prescribed by physician. Demonstrates appropriate use of inhaler and oxygen therapy.   Diabetes Yes   Intervention Provide education about signs/symptoms and action to take for hypo/hyperglycemia.;Provide education about proper nutrition, including hydration, and aerobic/resistive exercise prescription along with prescribed medications to achieve blood glucose in normal ranges: Fasting glucose 65-99 mg/dL   Expected Outcomes Short Term: Participant verbalizes understanding of the signs/symptoms and immediate care of hyper/hypoglycemia, proper foot care and importance of medication, aerobic/resistive exercise and nutrition plan for blood glucose control.;Long Term: Attainment of HbA1C < 7%.      Core Components/Risk Factors/Patient Goals Review:      Goals and Risk Factor Review    Row Name 01/15/16 1613             Core Components/Risk Factors/Patient Goals Review   Personal Goals Review Sedentary;Develop more efficient breathing techniques such as purse lipped breathing and diaphragmatic breathing  and practicing self-pacing with activity.;Diabetes       Review Jeremy Wells has attended 2 sessions of [facility]. He has obtained his exercise goals with RPE 11 and PD 3-4. He adjusts his oxygen to 4l/m and has good PLB techique. His glucose was monitored with a 48 point drop, but acceptable glucose level of 168.       Expected Outcomes Continue attending [facility] for progression in strength and stamina and increased knowledge of COPD management.          Core Components/Risk Factors/Patient Goals at Discharge (Final Review):      Goals and Risk Factor Review - 01/15/16 1613      Core Components/Risk Factors/Patient Goals Review   Personal Goals Review Sedentary;Develop more efficient breathing techniques such as purse lipped breathing and diaphragmatic breathing and practicing self-pacing with activity.;Diabetes   Review Jeremy Wells has attended 2 sessions of [facility]. He has obtained his exercise goals with RPE 11 and PD 3-4. He adjusts his oxygen to 4l/m and has good PLB techique. His glucose was monitored with a 48 point drop, but acceptable glucose level of 168.   Expected Outcomes Continue attending [facility] for progression in strength and stamina and increased knowledge of COPD management.      ITP Comments:     ITP Comments    Row Name 01/08/16 1522 01/20/16 1457         ITP Comments Jeremy Wells arrived today for [facility], but he was too nauseaed exercise. He hopes to return on Friday.  Jeremy Wells last attended [facility] 01/13/16 called today to check on him. He is very short of breath and has started on an antibiotic  which caused nauseate and started on prednisone. He has an appointment with Dr Cherly Hensen 01/21/16.         Comments: 30 note review

## 2016-01-20 NOTE — Progress Notes (Signed)
Jeremy Wells psychosocial assessment reveals no barriers at this time to participation in Pulmonary Rehab.  He  has good family and friend support that encourages Jeremy Wells to participate in [facility] and progress with His goals.  Jeremy Wells concerns are monitored, but he  has acknowledge that attending the program has helped to maintain quality life with improved mobility, self-care, and emotional and financial stability.  Jeremy Wells is commended for regular attendance and self-motivation to improve His pulmonary disease management. Jeremy Wells has been sick for 2 weeks, but after calling him today, he states he is planning to get back to [facility] and to "hold his spot"! He is enjoying the class and knows the benefits of attending [facility].

## 2016-01-22 NOTE — Telephone Encounter (Signed)
Mr Jeremy Wells last attended [facility] 01/13/16. I called today to check on him. He is very short of breath and was started on an antibiotic which caused nauseate and started on prednisone. He has an appointment with Dr Cherly Hensenhang 01/21/16.

## 2016-02-05 ENCOUNTER — Encounter: Payer: Medicare Other | Attending: Pulmonary Disease

## 2016-02-05 ENCOUNTER — Telehealth: Payer: Self-pay | Admitting: *Deleted

## 2016-02-05 ENCOUNTER — Encounter: Payer: Self-pay | Admitting: *Deleted

## 2016-02-05 DIAGNOSIS — J432 Centrilobular emphysema: Secondary | ICD-10-CM | POA: Insufficient documentation

## 2016-02-05 NOTE — Telephone Encounter (Signed)
Called to let them know that [name] has clearance to return to rehab.  They will try for Friday.

## 2016-02-17 ENCOUNTER — Encounter: Payer: Self-pay | Admitting: Respiratory Therapy

## 2016-02-17 DIAGNOSIS — J432 Centrilobular emphysema: Secondary | ICD-10-CM

## 2016-02-17 NOTE — Progress Notes (Signed)
Pulmonary Individual Treatment Plan  Patient Details  Name: Jeremy Wells. MRN: [id number] Date of Birth: Oct 12, 1940 Referring Provider:   Flowsheet Row [facility]monary [facility]  Referring Provider  Cherly Hensenhang      Initial Encounter Date:  Flowsheet Row Pulmonary Rehab [facility] 12[facility]31/[facility]  Date  12/31/15  Referring Provider  Cherly Hensenhang      Visit Diagnosis: Centrilobular emphysema (HCC)  Patient's Home Medications on Admission: No current outpatient prescriptions on file.  Past Medical History: No past medical history on file.  Tobacco Use: History  Smoking Status   Not on file  Smokeless Tobacco   Not on file    Labs: Recent Review Flowsheet Data    There is no flowsheet data to display.       ADL UCSD:     Pulmonary Assessment Scores    Row Name 12/31/15 1330         ADL UCSD   ADL Phase Entry     SOB Score total 93     Rest 3     Walk 5     Stairs 5     Bath 3     Dress 3     Shop 5        Pulmonary Function Assessment:     Pulmonary Function Assessment - 12/31/15 1329      Initial Spirometry Results   FVC% 34 %   FEV1% 26 %   FEV1/FVC Ratio 55.82   Comments Test date: 12/31/15     Post Bronchodilator Spirometry Results   FVC% 42 %   FEV1% 36 %   FEV1/FVC Ratio 56.95     Breath   Bilateral Breath Sounds Decreased;Wheezes;Expiratory   Shortness of Breath Yes;Limiting activity;Fear of Shortness of Breath      Exercise Target Goals:    Exercise Program Goal: Individual exercise prescription set with THRR, safety & activity barriers. Participant demonstrates ability to understand and report RPE using BORG scale, to self-measure pulse accurately, and to acknowledge the importance of the exercise prescription.  Exercise Prescription Goal: Starting with aerobic activity 30 plus minutes a day, 3 days per week for initial exercise prescription. Provide home  exercise prescription and guidelines that participant acknowledges understanding prior to discharge.  Activity Barriers & Risk Stratification:     Activity Barriers & Cardiac Risk Stratification - 12/31/15 1329      Activity Barriers & Cardiac Risk Stratification   Activity Barriers Back Problems;Shortness of Breath;Deconditioning;Muscular Weakness      6 Minute Walk:     6 Minute Walk    Row Name 12/31/15 1146         6 Minute Walk   Distance 355 feet     Walk Time 2.5 minutes     # of Rest Breaks 1     MPH 1.6     METS 2.23     RPE 13     Perceived Dyspnea  5     VO2 Peak 4.36     Symptoms Yes (comment)     Comments stopped due to back pain 8 out of 10     Resting HR 99 bpm     Resting BP 130/72     Max Ex. HR 116 bpm     Max Ex. BP 154/58     2 Minute Post BP 118/58       Interval HR   Baseline HR 99  1 Minute HR 106     2 Minute HR 116     2 Minute Post HR 95     Interval Heart Rate? Yes       Interval Oxygen   Interval Oxygen? Yes     Baseline Oxygen Saturation % 94 %     Baseline Liters of Oxygen 2 L     1 Minute Oxygen Saturation % 92 %     1 Minute Liters of Oxygen 2 L     2 Minute Oxygen Saturation % 92 %     2 Minute Liters of Oxygen 2 L     2 Minute Post Oxygen Saturation % 94 %     2 Minute Post Liters of Oxygen 2 L        Initial Exercise Prescription:     Initial Exercise Prescription - 12/31/15 1100      Date of Initial Exercise RX and Referring Provider   Date 12/31/15   Referring Provider Cherly Hensen     Oxygen   Oxygen Continuous   Liters 2     NuStep   Level 2   Minutes 15   METs 2     Arm Ergometer   Level 1   Minutes 15   METs 2     REL-XR   Level 2   Minutes 15   METs 2     T5 Nustep   Level 1   Minutes 15   METs 2     Biostep-RELP   Level 2   Minutes 15   METs 2     Intensity   THRR 40-80% of Max Heartrate 117-136   Ratings of Perceived Exertion 11-13   Perceived Dyspnea 0-4     Progression    Progression Continue to progress workloads to maintain intensity without signs/symptoms of physical distress.     Resistance Training   Training Prescription Yes   Weight 3   Reps 10-12      Perform Capillary Blood Glucose checks as needed.  Exercise Prescription Changes:     Exercise Prescription Changes    Row Name 01/06/16 1400 01/22/16 1500           Exercise Review   Progression  -- Yes        Response to Exercise   Blood Pressure (Admit) 110/60 118/60      Blood Pressure (Exercise) 128/74 160/80      Blood Pressure (Exit) 100/60 128/62      Heart Rate (Admit) 98 bpm 93 bpm      Heart Rate (Exercise) 103 bpm 94 bpm      Heart Rate (Exit) 94 bpm 88 bpm      Oxygen Saturation (Admit) 90 % 98 %      Oxygen Saturation (Exercise) 95 % 95 %      Oxygen Saturation (Exit) 97 % 97 %      Rating of Perceived Exertion (Exercise) 13 13      Perceived Dyspnea (Exercise) 4 3      Symptoms fatigue fatigue      Comments --  Omitted last exercise due to fatigue  --      Duration Progress to 45 minutes of aerobic exercise without signs/symptoms of physical distress Progress to 45 minutes of aerobic exercise without signs/symptoms of physical distress      Intensity THRR unchanged THRR unchanged        Progression   Progression Continue to progress workloads to maintain intensity  without signs/symptoms of physical distress. Continue to progress workloads to maintain intensity without signs/symptoms of physical distress.      Average METs  -- 2.55        Resistance Training   Training Prescription Yes Yes      Weight 3 3      Reps 10-12 10-12        Interval Training   Interval Training No No        Oxygen   Oxygen Continuous Continuous      Liters 2 2        Arm Ergometer   Level 1 1      Minutes 15  5/5 15      METs 1.7 1.9        REL-XR   Level  -- 1      Minutes  -- 15      METs  -- 3.2        T5 Nustep   Level 2 --  omitted due to fatigue      Minutes 3   --         Exercise Comments:     Exercise Comments    Row Name 01/06/16 1452 01/22/16 1515 02/05/16 1506       Exercise Comments Jeremy Wells left early due to fatigue.  His vitals during exercise were within normal limits.  He is very deconditioned and staff will continue to work with him to increase endurance. Hilman is off to a fairly good start with rehab.  He has had two days of exercise.  We will continue to monitor his progress. Out since 01/13/16.        Discharge Exercise Prescription (Final Exercise Prescription Changes):     Exercise Prescription Changes - 01/22/16 1500      Exercise Review   Progression Yes     Response to Exercise   Blood Pressure (Admit) 118/60   Blood Pressure (Exercise) 160/80   Blood Pressure (Exit) 128/62   Heart Rate (Admit) 93 bpm   Heart Rate (Exercise) 94 bpm   Heart Rate (Exit) 88 bpm   Oxygen Saturation (Admit) 98 %   Oxygen Saturation (Exercise) 95 %   Oxygen Saturation (Exit) 97 %   Rating of Perceived Exertion (Exercise) 13   Perceived Dyspnea (Exercise) 3   Symptoms fatigue   Duration Progress to 45 minutes of aerobic exercise without signs/symptoms of physical distress   Intensity THRR unchanged     Progression   Progression Continue to progress workloads to maintain intensity without signs/symptoms of physical distress.   Average METs 2.55     Resistance Training   Training Prescription Yes   Weight 3   Reps 10-12     Interval Training   Interval Training No     Oxygen   Oxygen Continuous   Liters 2     Arm Ergometer   Level 1   Minutes 15   METs 1.9     REL-XR   Level 1   Minutes 15   METs 3.2     T5 Nustep   Level --  omitted due to fatigue       Nutrition:  Target Goals: Understanding of nutrition guidelines, daily intake of sodium 1500mg , cholesterol 200mg , calories 30% from fat and 7% or less from saturated fats, daily to have 5 or more servings of fruits and vegetables.  Biometrics:     Pre  Biometrics - 12/31/15 1145  Pre Biometrics   Height 5' 9.5" (1.765 m)   Weight 209 lb 1.6 oz (94.8 kg)   Waist Circumference 46.25 inches   Hip Circumference 41.75 inches   Waist to Hip Ratio 1.11 %   BMI (Calculated) 30.5       Nutrition Therapy Plan and Nutrition Goals:   Nutrition Discharge: Rate Your Plate Scores:   Psychosocial: Target Goals: Acknowledge presence or absence of depression, maximize coping skills, provide positive support system. Participant is able to verbalize types and ability to use techniques and skills needed for reducing stress and depression.  Initial Review & Psychosocial Screening:     Initial Psych Review & Screening - 12/31/15 1334      Family Dynamics   Good Support System? Yes   Comments Jeremy Wells has a very supportive family, including his wife and 3 children. His children live close and help with many chores at Jeremy Wells home. He is looking forward to more activity and possibly return to fishing and hunting.     Barriers   Psychosocial barriers to participate in program The patient should benefit from training in stress management and relaxation.     Screening Interventions   Interventions Encouraged to exercise;Program counselor consult      Quality of Life Scores:     Quality of Life - 12/31/15 1335      Quality of Life Scores   Health/Function Pre 19.63 %   Socioeconomic Pre 19.13 %   Psych/Spiritual Pre 20.67 %   Family Pre 21 %   GLOBAL Pre 19.89 %      PHQ-9: Recent Review Flowsheet Data    Depression screen [facility] 2/9 12/31/2015   Decreased Interest 1   Down, Depressed, Hopeless 1   PHQ - 2 Score 2   Altered sleeping 3   Tired, decreased energy 3   Change in appetite 1   Feeling bad or failure about yourself  0   Trouble concentrating 1   Moving slowly or fidgety/restless 0   Suicidal thoughts 0   PHQ-9 Score 10   Difficult doing work/chores Very difficult      Psychosocial Evaluation and  Intervention:   Psychosocial Re-Evaluation:     Psychosocial Re-Evaluation    Row Name 01/20/16 1502 01/20/16 1503           Psychosocial Re-Evaluation   Comments Jeremy Wells last attended [facility] 01/13/16. I called today to check on him. He is very short of breath and was started on an antibiotic which caused nauseate and started on prednisone. He has an appointment with Dr Cherly Hensen 01/21/16. Jeremy Wells psychosocial assessment reveals no barriers at this time to participation in Pulmonary Rehab.  He  has good family and friend support that encourages Jeremy Wells to participate in [facility] and progress with His goals.  Jeremy Wells concerns are monitored, but he  has acknowledge that attending the program has helped to maintain quality life with improved mobility, self-care, and emotional and financial stability.  Jeremy Wells is commended for regular attendance and self-motivation to improve His pulmonary disease management. Jeremy Wells has been sick for 2 weeks, but after calling him today, he states he is planning to get back to [facility] and to "hold his spot"! He is enjoying the class and knows the benefits of attending [facility].         Education: Education Goals: Education classes will be provided on a weekly basis, covering required topics. Participant will state understanding/return demonstration of topics presented.  Learning Barriers/Preferences:  Learning Barriers/Preferences - 12/31/15 1329      Learning Barriers/Preferences   Learning Barriers None   Learning Preferences None      Education Topics: Initial Evaluation Education: - Verbal, written and demonstration of respiratory meds, RPE/PD scales, oximetry and breathing techniques. Instruction on use of nebulizers and MDIs: cleaning and proper use, rinsing mouth with steroid doses and importance of monitoring MDI activations. Flowsheet Row Pulmonary Rehab from 01/06/2016 in [facility] Cardiac and Pulmonary Rehab  Date  12/31/15  Educator  LB   Instruction Review Code  2- meets goals/outcomes      General Nutrition Guidelines/Fats and Fiber: -Group instruction provided by verbal, written material, models and posters to present the general guidelines for heart healthy nutrition. Gives an explanation and review of dietary fats and fiber.   Controlling Sodium/Reading Food Labels: -Group verbal and written material supporting the discussion of sodium use in heart healthy nutrition. Review and explanation with models, verbal and written materials for utilization of the food label.   Exercise Physiology & Risk Factors: - Group verbal and written instruction with models to review the exercise physiology of the cardiovascular system and associated critical values. Details cardiovascular disease risk factors and the goals associated with each risk factor.   Aerobic Exercise & Resistance Training: - Gives group verbal and written discussion on the health impact of inactivity. On the components of aerobic and resistive training programs and the benefits of this training and how to safely progress through these programs.   Flexibility, Balance, General Exercise Guidelines: - Provides group verbal and written instruction on the benefits of flexibility and balance training programs. Provides general exercise guidelines with specific guidelines to those with heart or lung disease. Demonstration and skill practice provided.   Stress Management: - Provides group verbal and written instruction about the health risks of elevated stress, cause of high stress, and healthy ways to reduce stress.   Depression: - Provides group verbal and written instruction on the correlation between heart/lung disease and depressed mood, treatment options, and the stigmas associated with seeking treatment. Flowsheet Row Pulmonary Rehab from 01/06/2016 in [facility] Cardiac and Pulmonary Rehab  Date  01/06/16  Educator  [facility]  Instruction Review Code  2- meets  goals/outcomes      Exercise & Equipment Safety: - Individual verbal instruction and demonstration of equipment use and safety with use of the equipment.   Infection Prevention: - Provides verbal and written material to individual with discussion of infection control including proper hand washing and proper equipment cleaning during exercise session. Flowsheet Row Pulmonary Rehab from 01/06/2016 in [facility] Cardiac and Pulmonary Rehab  Date  12/31/15  Educator  LB  Instruction Review Code  2- meets goals/outcomes      Falls Prevention: - Provides verbal and written material to individual with discussion of falls prevention and safety. Flowsheet Row Pulmonary Rehab from 01/06/2016 in [facility] Cardiac and Pulmonary Rehab  Date  12/31/15  Educator  LB  Instruction Review Code  2- meets goals/outcomes      Diabetes: - Individual verbal and written instruction to review signs/symptoms of diabetes, desired ranges of glucose level fasting, after meals and with exercise. Advice that pre and post exercise glucose checks will be done for 3 sessions at entry of program. Flowsheet Row Pulmonary Rehab from 01/06/2016 in [facility] Cardiac and Pulmonary Rehab  Date  01/06/16  Educator  LB  Instruction Review Code  2- meets goals/outcomes      Chronic Lung Diseases: - Group verbal and  written instruction to review new updates, new respiratory medications, new advancements in procedures and treatments. Provide informative websites and "800" numbers of self-education.   Lung Procedures: - Group verbal and written instruction to describe testing methods done to diagnose lung disease. Review the outcome of test results. Describe the treatment choices: Pulmonary Function Tests, ABGs and oximetry.   Energy Conservation: - Provide group verbal and written instruction for methods to conserve energy, plan and organize activities. Instruct on pacing techniques, use of adaptive equipment and posture/positioning to  relieve shortness of breath.   Triggers: - Group verbal and written instruction to review types of environmental controls: home humidity, furnaces, filters, dust mite/pet prevention, HEPA vacuums. To discuss weather changes, air quality and the benefits of nasal washing.   Exacerbations: - Group verbal and written instruction to provide: warning signs, infection symptoms, calling MD promptly, preventive modes, and value of vaccinations. Review: effective airway clearance, coughing and/or vibration techniques. Create an Sport and exercise psychologist.   Oxygen: - Individual and group verbal and written instruction on oxygen therapy. Includes supplement oxygen, available portable oxygen systems, continuous and intermittent flow rates, oxygen safety, concentrators, and Medicare reimbursement for oxygen. Flowsheet Row Pulmonary Rehab from 01/06/2016 in [facility] Cardiac and Pulmonary Rehab  Date  12/31/15  Educator  LB  Instruction Review Code  2- meets goals/outcomes      Respiratory Medications: - Group verbal and written instruction to review medications for lung disease. Drug class, frequency, complications, importance of spacers, rinsing mouth after steroid MDI's, and proper cleaning methods for nebulizers. Flowsheet Row Pulmonary Rehab from 01/06/2016 in [facility] Cardiac and Pulmonary Rehab  Date  12/31/15  Educator  LB  Instruction Review Code  2- meets goals/outcomes      AED/CPR: - Group verbal and written instruction with the use of models to demonstrate the basic use of the AED with the basic ABC's of resuscitation.   Breathing Retraining: - Provides individuals verbal and written instruction on purpose, frequency, and proper technique of diaphragmatic breathing and pursed-lipped breathing. Applies individual practice skills. Flowsheet Row Pulmonary Rehab from 01/06/2016 in [facility] Cardiac and Pulmonary Rehab  Date  01/30/16  Educator  LB  Instruction Review Code  2- meets goals/outcomes      Anatomy  and Physiology of the Lungs: - Group verbal and written instruction with the use of models to provide basic lung anatomy and physiology related to function, structure and complications of lung disease.   Heart Failure: - Group verbal and written instruction on the basics of heart failure: signs/symptoms, treatments, explanation of ejection fraction, enlarged heart and cardiomyopathy.   Sleep Apnea: - Individual verbal and written instruction to review Obstructive Sleep Apnea. Review of risk factors, methods for diagnosing and types of masks and machines for OSA.   Anxiety: - Provides group, verbal and written instruction on the correlation between heart/lung disease and anxiety, treatment options, and management of anxiety.   Relaxation: - Provides group, verbal and written instruction about the benefits of relaxation for patients with heart/lung disease. Also provides patients with examples of relaxation techniques.   Knowledge Questionnaire Score:     Knowledge Questionnaire Score - 12/31/15 1329      Knowledge Questionnaire Score   Pre Score 5/10       Core Components/Risk Factors/Patient Goals at Admission:     Personal Goals and Risk Factors at Admission - 12/31/15 1333      Core Components/Risk Factors/Patient Goals on Admission   Sedentary Yes   Intervention Provide advice,  education, support and counseling about physical activity/exercise needs.;Develop an individualized exercise prescription for aerobic and resistive training based on initial evaluation findings, risk stratification, comorbidities and participant's personal goals.   Expected Outcomes Achievement of increased cardiorespiratory fitness and enhanced flexibility, muscular endurance and strength shown through measurements of functional capacity and personal statement of participant.   Increase Strength and Stamina Yes   Intervention Provide advice, education, support and counseling about physical  activity/exercise needs.;Develop an individualized exercise prescription for aerobic and resistive training based on initial evaluation findings, risk stratification, comorbidities and participant's personal goals.   Expected Outcomes Achievement of increased cardiorespiratory fitness and enhanced flexibility, muscular endurance and strength shown through measurements of functional capacity and personal statement of participant.   Improve shortness of breath with ADL's Yes   Intervention Provide education, individualized exercise plan and daily activity instruction to help decrease symptoms of SOB with activities of daily living.   Expected Outcomes Short Term: Achieves a reduction of symptoms when performing activities of daily living.   Develop more efficient breathing techniques such as purse lipped breathing and diaphragmatic breathing; and practicing self-pacing with activity Yes   Intervention Provide education, demonstration and support about specific breathing techniuqes utilized for more efficient breathing. Include techniques such as pursed lipped breathing, diaphragmatic breathing and self-pacing activity.   Expected Outcomes Short Term: Participant will be able to demonstrate and use breathing techniques as needed throughout daily activities.   Increase knowledge of respiratory medications and ability to use respiratory devices properly  Yes   Intervention Provide education and demonstration as needed of appropriate use of medications, inhalers, and oxygen therapy.   Expected Outcomes Short Term: Achieves understanding of medications use. Understands that oxygen is a medication prescribed by physician. Demonstrates appropriate use of inhaler and oxygen therapy.   Diabetes Yes   Intervention Provide education about signs/symptoms and action to take for hypo/hyperglycemia.;Provide education about proper nutrition, including hydration, and aerobic/resistive exercise prescription along with  prescribed medications to achieve blood glucose in normal ranges: Fasting glucose 65-99 mg/dL   Expected Outcomes Short Term: Participant verbalizes understanding of the signs/symptoms and immediate care of hyper/hypoglycemia, proper foot care and importance of medication, aerobic/resistive exercise and nutrition plan for blood glucose control.;Long Term: Attainment of HbA1C < 7%.      Core Components/Risk Factors/Patient Goals Review:      Goals and Risk Factor Review    Row Name 01/15/16 1613             Core Components/Risk Factors/Patient Goals Review   Personal Goals Review Sedentary;Develop more efficient breathing techniques such as purse lipped breathing and diaphragmatic breathing and practicing self-pacing with activity.;Diabetes       Review Jeremy Wells has attended 2 sessions of [facility]. He has obtained his exercise goals with RPE 11 and PD 3-4. He adjusts his oxygen to 4l/m and has good PLB techique. His glucose was monitored with a 48 point drop, but acceptable glucose level of 168.       Expected Outcomes Continue attending [facility] for progression in strength and stamina and increased knowledge of COPD management.          Core Components/Risk Factors/Patient Goals at Discharge (Final Review):      Goals and Risk Factor Review - 01/15/16 1613      Core Components/Risk Factors/Patient Goals Review   Personal Goals Review Sedentary;Develop more efficient breathing techniques such as purse lipped breathing and diaphragmatic breathing and practicing self-pacing with activity.;Diabetes   Review Jeremy Wells  has attended 2 sessions of [facility]. He has obtained his exercise goals with RPE 11 and PD 3-4. He adjusts his oxygen to 4l/m and has good PLB techique. His glucose was monitored with a 48 point drop, but acceptable glucose level of 168.   Expected Outcomes Continue attending [facility] for progression in strength and stamina and increased knowledge of COPD management.       ITP Comments:     ITP Comments    Row Name 01/08/16 1522 01/20/16 1457 02/05/16 1506       ITP Comments Jeremy Wells arrived today for [facility], but he was too nauseaed exercise. He hopes to return on Friday.  Jeremy Wells last attended [facility] 01/13/16 called today to check on him. He is very short of breath and has started on an antibiotic which caused nauseate and started on prednisone. He has an appointment with Dr Cherly Hensen 01/21/16. Called to let them know that Jeremy Wells has clearance to return to rehab.  They will try for Friday.        Comments: 30 day note review

## 2016-02-18 ENCOUNTER — Telehealth: Payer: Self-pay | Admitting: *Deleted

## 2016-02-18 ENCOUNTER — Encounter: Payer: Self-pay | Admitting: *Deleted

## 2016-02-18 DIAGNOSIS — J432 Centrilobular emphysema: Secondary | ICD-10-CM

## 2016-02-18 NOTE — Progress Notes (Signed)
Discharge Summary  Patient Details  Name: Jeremy CORIGLIANO Sr. MRN: [id number] Date of Birth: 01/11/1941 Referring Provider:   Flowsheet Row Pulmonary Rehab from 12/31/2015 in [facility]  Referring Provider  Cherly Hensen       Number of Visits: 4  Reason for Discharge:  Early Exit:  Personal  Smoking History:  History  Smoking Status  . Not on file  Smokeless Tobacco  . Not on file    Diagnosis:  Centrilobular emphysema (HCC)  ADL [facility]:     Pulmonary Assessment Scores    Row Name 12/31/15 1330         ADL [facility]   ADL Phase Entry     SOB Score total 93     Rest 3     Walk 5     Stairs 5     Bath 3     Dress 3     Shop 5        Initial Exercise Prescription:     Initial Exercise Prescription - 12/31/15 1100      Date of Initial Exercise RX and Referring Provider   Date 12/31/15   Referring Provider Cherly Hensen     Oxygen   Oxygen Continuous   Liters 2     NuStep   Level 2   Minutes 15   METs 2     Arm Ergometer   Level 1   Minutes 15   METs 2     REL-XR   Level 2   Minutes 15   METs 2     T5 Nustep   Level 1   Minutes 15   METs 2     Biostep-RELP   Level 2   Minutes 15   METs 2     Intensity   THRR 40-80% of Max Heartrate 117-136   Ratings of Perceived Exertion 11-13   Perceived Dyspnea 0-4     Progression   Progression Continue to progress workloads to maintain intensity without signs/symptoms of physical distress.     Resistance Training   Training Prescription Yes   Weight 3   Reps 10-12      Discharge Exercise Prescription (Final Exercise Prescription Changes):     Exercise Prescription Changes - 01/22/16 1500      Exercise Review   Progression Yes     Response to Exercise   Blood Pressure (Admit) 118/60   Blood Pressure (Exercise) 160/80   Blood Pressure (Exit) 128/62   Heart Rate (Admit) 93 bpm   Heart Rate (Exercise) 94 bpm   Heart Rate (Exit) 88 bpm   Oxygen Saturation (Admit) 98 %   Oxygen Saturation (Exercise) 95 %   Oxygen Saturation (Exit) 97 %   Rating of Perceived Exertion (Exercise) 13   Perceived Dyspnea (Exercise) 3   Symptoms fatigue   Duration Progress to 45 minutes of aerobic exercise without signs/symptoms of physical distress   Intensity THRR unchanged     Progression   Progression Continue to progress workloads to maintain intensity without signs/symptoms of physical distress.   Average METs 2.55     Resistance Training   Training Prescription Yes   Weight 3   Reps 10-12     Interval Training   Interval Training No     Oxygen   Oxygen Continuous   Liters 2     Arm Ergometer   Level 1   Minutes 15   METs 1.9     REL-XR   Level  1   Minutes 15   METs 3.2     T5 Nustep   Level --  omitted due to fatigue      Functional Capacity:     6 Minute Walk    Row Name 12/31/15 1146         6 Minute Walk   Distance 355 feet     Walk Time 2.5 minutes     # of Rest Breaks 1     MPH 1.6     METS 2.23     RPE 13     Perceived Dyspnea  5     VO2 Peak 4.36     Symptoms Yes (comment)     Comments stopped due to back pain 8 out of 10     Resting HR 99 bpm     Resting BP 130/72     Max Ex. HR 116 bpm     Max Ex. BP 154/58     2 Minute Post BP 118/58       Interval HR   Baseline HR 99     1 Minute HR 106     2 Minute HR 116     2 Minute Post HR 95     Interval Heart Rate? Yes       Interval Oxygen   Interval Oxygen? Yes     Baseline Oxygen Saturation % 94 %     Baseline Liters of Oxygen 2 L     1 Minute Oxygen Saturation % 92 %     1 Minute Liters of Oxygen 2 L     2 Minute Oxygen Saturation % 92 %     2 Minute Liters of Oxygen 2 L     2 Minute Post Oxygen Saturation % 94 %     2 Minute Post Liters of Oxygen 2 L        Psychological, QOL, Others - Outcomes: PHQ 2/9: Depression screen PHQ 2/9 12/31/2015  Decreased Interest 1  Down, Depressed, Hopeless 1  PHQ - 2 Score 2  Altered sleeping 3  Tired, decreased energy 3   Change in appetite 1  Feeling bad or failure about yourself  0  Trouble concentrating 1  Moving slowly or fidgety/restless 0  Suicidal thoughts 0  PHQ-9 Score 10  Difficult doing work/chores Very difficult    Quality of Life:     Quality of Life - 12/31/15 1335      Quality of Life Scores   Health/Function Pre 19.63 %   Socioeconomic Pre 19.13 %   Psych/Spiritual Pre 20.67 %   Family Pre 21 %   GLOBAL Pre 19.89 %      Personal Goals: Goals established at orientation with interventions provided to work toward goal.     Personal Goals and Risk Factors at Admission - 12/31/15 1333      Core Components/Risk Factors/Patient Goals on Admission   Sedentary Yes   Intervention Provide advice, education, support and counseling about physical activity/exercise needs.;Develop an individualized exercise prescription for aerobic and resistive training based on initial evaluation findings, risk stratification, comorbidities and participant's personal goals.   Expected Outcomes Achievement of increased cardiorespiratory fitness and enhanced flexibility, muscular endurance and strength shown through measurements of functional capacity and personal statement of participant.   Increase Strength and Stamina Yes   Intervention Provide advice, education, support and counseling about physical activity/exercise needs.;Develop an individualized exercise prescription for aerobic and resistive training based on initial evaluation findings, risk  stratification, comorbidities and participant's personal goals.   Expected Outcomes Achievement of increased cardiorespiratory fitness and enhanced flexibility, muscular endurance and strength shown through measurements of functional capacity and personal statement of participant.   Improve shortness of breath with ADL's Yes   Intervention Provide education, individualized exercise plan and daily activity instruction to help decrease symptoms of SOB with  activities of daily living.   Expected Outcomes Short Term: Achieves a reduction of symptoms when performing activities of daily living.   Develop more efficient breathing techniques such as purse lipped breathing and diaphragmatic breathing; and practicing self-pacing with activity Yes   Intervention Provide education, demonstration and support about specific breathing techniuqes utilized for more efficient breathing. Include techniques such as pursed lipped breathing, diaphragmatic breathing and self-pacing activity.   Expected Outcomes Short Term: Participant will be able to demonstrate and use breathing techniques as needed throughout daily activities.   Increase knowledge of respiratory medications and ability to use respiratory devices properly  Yes   Intervention Provide education and demonstration as needed of appropriate use of medications, inhalers, and oxygen therapy.   Expected Outcomes Short Term: Achieves understanding of medications use. Understands that oxygen is a medication prescribed by physician. Demonstrates appropriate use of inhaler and oxygen therapy.   Diabetes Yes   Intervention Provide education about signs/symptoms and action to take for hypo/hyperglycemia.;Provide education about proper nutrition, including hydration, and aerobic/resistive exercise prescription along with prescribed medications to achieve blood glucose in normal ranges: Fasting glucose 65-99 mg/dL   Expected Outcomes Short Term: Participant verbalizes understanding of the signs/symptoms and immediate care of hyper/hypoglycemia, proper foot care and importance of medication, aerobic/resistive exercise and nutrition plan for blood glucose control.;Long Term: Attainment of HbA1C < 7%.       Personal Goals Discharge:     Goals and Risk Factor Review    Row Name 01/15/16 1613             Core Components/Risk Factors/Patient Goals Review   Personal Goals Review Sedentary;Develop more efficient  breathing techniques such as purse lipped breathing and diaphragmatic breathing and practicing self-pacing with activity.;Diabetes       Review Mr Jeremy BirkCaldwell has attended 2 sessions of [facility]. He has obtained his exercise goals with RPE 11 and PD 3-4. He adjusts his oxygen to 4l/m and has good PLB techique. His glucose was monitored with a 48 point drop, but acceptable glucose level of 168.       Expected Outcomes Continue attending [facility] for progression in strength and stamina and increased knowledge of COPD management.          Nutrition & Weight - Outcomes:     Pre Biometrics - 12/31/15 1145      Pre Biometrics   Height 5' 9.5" (1.765 m)   Weight 209 lb 1.6 oz (94.8 kg)   Waist Circumference 46.25 inches   Hip Circumference 41.75 inches   Waist to Hip Ratio 1.11 %   BMI (Calculated) 30.5       Nutrition:   Nutrition Discharge:   Education Questionnaire Score:     Knowledge Questionnaire Score - 12/31/15 1329      Knowledge Questionnaire Score   Pre Score 5/10      Goals reviewed with patient; copy given to patient.

## 2016-02-18 NOTE — Telephone Encounter (Signed)
Called to check on status of return.  Jeremy Wells stated that he did not wish to return to rehab and would like to be discharged from the program.

## 2016-02-18 NOTE — Progress Notes (Signed)
Pulmonary Individual Treatment Plan  Patient Details  Name: Jeremy SHEETZ Sr. MRN: [id number] Date of Birth: November 18, 1940 Referring Provider:   Flowsheet Row Pulmonary Rehab from 12/31/2015 in [facility] Cardiac and Pulmonary Rehab  Referring Provider  Cherly Hensen      Initial Encounter Date:  Flowsheet Row Pulmonary Rehab from 12/31/2015 in [facility] Cardiac and Pulmonary Rehab  Date  12/31/15  Referring Provider  Cherly Hensen      Visit Diagnosis: Centrilobular emphysema (HCC)  Patient's Home Medications on Admission: No current outpatient prescriptions on file.  Past Medical History: No past medical history on file.  Tobacco Use: History  Smoking Status  . Not on file  Smokeless Tobacco  . Not on file    Labs: Recent Review Flowsheet Data    There is no flowsheet data to display.       ADL UCSD:     Pulmonary Assessment Scores    Row Name 12/31/15 1330         ADL UCSD   ADL Phase Entry     SOB Score total 93     Rest 3     Walk 5     Stairs 5     Bath 3     Dress 3     Shop 5        Pulmonary Function Assessment:     Pulmonary Function Assessment - 12/31/15 1329      Initial Spirometry Results   FVC% 34 %   FEV1% 26 %   FEV1/FVC Ratio 55.82   Comments Test date: 12/31/15     Post Bronchodilator Spirometry Results   FVC% 42 %   FEV1% 36 %   FEV1/FVC Ratio 56.95     Breath   Bilateral Breath Sounds Decreased;Wheezes;Expiratory   Shortness of Breath Yes;Limiting activity;Fear of Shortness of Breath      Exercise Target Goals:    Exercise Program Goal: Individual exercise prescription set with THRR, safety & activity barriers. Participant demonstrates ability to understand and report RPE using BORG scale, to self-measure pulse accurately, and to acknowledge the importance of the exercise prescription.  Exercise Prescription Goal: Starting with aerobic activity 30 plus minutes a day, 3 days per week for initial exercise prescription. Provide home  exercise prescription and guidelines that participant acknowledges understanding prior to discharge.  Activity Barriers & Risk Stratification:     Activity Barriers & Cardiac Risk Stratification - 12/31/15 1329      Activity Barriers & Cardiac Risk Stratification   Activity Barriers Back Problems;Shortness of Breath;Deconditioning;Muscular Weakness      6 Minute Walk:     6 Minute Walk    Row Name 12/31/15 1146         6 Minute Walk   Distance 355 feet     Walk Time 2.5 minutes     # of Rest Breaks 1     MPH 1.6     METS 2.23     RPE 13     Perceived Dyspnea  5     VO2 Peak 4.36     Symptoms Yes (comment)     Comments stopped due to back pain 8 out of 10     Resting HR 99 bpm     Resting BP 130/72     Max Ex. HR 116 bpm     Max Ex. BP 154/58     2 Minute Post BP 118/58       Interval HR   Baseline HR 99  1 Minute HR 106     2 Minute HR 116     2 Minute Post HR 95     Interval Heart Rate? Yes       Interval Oxygen   Interval Oxygen? Yes     Baseline Oxygen Saturation % 94 %     Baseline Liters of Oxygen 2 L     1 Minute Oxygen Saturation % 92 %     1 Minute Liters of Oxygen 2 L     2 Minute Oxygen Saturation % 92 %     2 Minute Liters of Oxygen 2 L     2 Minute Post Oxygen Saturation % 94 %     2 Minute Post Liters of Oxygen 2 L        Initial Exercise Prescription:     Initial Exercise Prescription - 12/31/15 1100      Date of Initial Exercise RX and Referring Provider   Date 12/31/15   Referring Provider Cherly Hensen     Oxygen   Oxygen Continuous   Liters 2     NuStep   Level 2   Minutes 15   METs 2     Arm Ergometer   Level 1   Minutes 15   METs 2     REL-XR   Level 2   Minutes 15   METs 2     T5 Nustep   Level 1   Minutes 15   METs 2     Biostep-RELP   Level 2   Minutes 15   METs 2     Intensity   THRR 40-80% of Max Heartrate 117-136   Ratings of Perceived Exertion 11-13   Perceived Dyspnea 0-4     Progression    Progression Continue to progress workloads to maintain intensity without signs/symptoms of physical distress.     Resistance Training   Training Prescription Yes   Weight 3   Reps 10-12      Perform Capillary Blood Glucose checks as needed.  Exercise Prescription Changes:     Exercise Prescription Changes    Row Name 01/06/16 1400 01/22/16 1500           Exercise Review   Progression  - Yes        Response to Exercise   Blood Pressure (Admit) 110/60 118/60      Blood Pressure (Exercise) 128/74 160/80      Blood Pressure (Exit) 100/60 128/62      Heart Rate (Admit) 98 bpm 93 bpm      Heart Rate (Exercise) 103 bpm 94 bpm      Heart Rate (Exit) 94 bpm 88 bpm      Oxygen Saturation (Admit) 90 % 98 %      Oxygen Saturation (Exercise) 95 % 95 %      Oxygen Saturation (Exit) 97 % 97 %      Rating of Perceived Exertion (Exercise) 13 13      Perceived Dyspnea (Exercise) 4 3      Symptoms fatigue fatigue      Comments -  Omitted last exercise due to fatigue  -      Duration Progress to 45 minutes of aerobic exercise without signs/symptoms of physical distress Progress to 45 minutes of aerobic exercise without signs/symptoms of physical distress      Intensity THRR unchanged THRR unchanged        Progression   Progression Continue to progress workloads to maintain intensity  without signs/symptoms of physical distress. Continue to progress workloads to maintain intensity without signs/symptoms of physical distress.      Average METs  - 2.55        Resistance Training   Training Prescription Yes Yes      Weight 3 3      Reps 10-12 10-12        Interval Training   Interval Training No No        Oxygen   Oxygen Continuous Continuous      Liters 2 2        Arm Ergometer   Level 1 1      Minutes 15  5/5 15      METs 1.7 1.9        REL-XR   Level  - 1      Minutes  - 15      METs  - 3.2        T5 Nustep   Level 2 -  omitted due to fatigue      Minutes 3  -          Exercise Comments:     Exercise Comments    Row Name 01/06/16 1452 01/22/16 1515 02/05/16 1506       Exercise Comments Jeremy Wells left early due to fatigue.  His vitals during exercise were within normal limits.  He is very deconditioned and staff will continue to work with him to increase endurance. Jeremy Wells is off to a fairly good start with rehab.  He has had two days of exercise.  We will continue to monitor his progress. Out since 01/13/16.        Discharge Exercise Prescription (Final Exercise Prescription Changes):     Exercise Prescription Changes - 01/22/16 1500      Exercise Review   Progression Yes     Response to Exercise   Blood Pressure (Admit) 118/60   Blood Pressure (Exercise) 160/80   Blood Pressure (Exit) 128/62   Heart Rate (Admit) 93 bpm   Heart Rate (Exercise) 94 bpm   Heart Rate (Exit) 88 bpm   Oxygen Saturation (Admit) 98 %   Oxygen Saturation (Exercise) 95 %   Oxygen Saturation (Exit) 97 %   Rating of Perceived Exertion (Exercise) 13   Perceived Dyspnea (Exercise) 3   Symptoms fatigue   Duration Progress to 45 minutes of aerobic exercise without signs/symptoms of physical distress   Intensity THRR unchanged     Progression   Progression Continue to progress workloads to maintain intensity without signs/symptoms of physical distress.   Average METs 2.55     Resistance Training   Training Prescription Yes   Weight 3   Reps 10-12     Interval Training   Interval Training No     Oxygen   Oxygen Continuous   Liters 2     Arm Ergometer   Level 1   Minutes 15   METs 1.9     REL-XR   Level 1   Minutes 15   METs 3.2     T5 Nustep   Level --  omitted due to fatigue       Nutrition:  Target Goals: Understanding of nutrition guidelines, daily intake of sodium 1500mg , cholesterol 200mg , calories 30% from fat and 7% or less from saturated fats, daily to have 5 or more servings of fruits and vegetables.  Biometrics:     Pre  Biometrics - 12/31/15 1145  Pre Biometrics   Height 5' 9.5" (1.765 m)   Weight 209 lb 1.6 oz (94.8 kg)   Waist Circumference 46.25 inches   Hip Circumference 41.75 inches   Waist to Hip Ratio 1.11 %   BMI (Calculated) 30.5       Nutrition Therapy Plan and Nutrition Goals:   Nutrition Discharge: Rate Your Plate Scores:   Psychosocial: Target Goals: Acknowledge presence or absence of depression, maximize coping skills, provide positive support system. Participant is able to verbalize types and ability to use techniques and skills needed for reducing stress and depression.  Initial Review & Psychosocial Screening:     Initial Psych Review & Screening - 12/31/15 1334      Family Dynamics   Good Support System? Yes   Comments Jeremy Wells has a very supportive family, including his wife and 3 children. His children live close and help with many chores at Jeremy Wells's home. He is looking forward to more activity and possibly return to fishing and hunting.     Barriers   Psychosocial barriers to participate in program The patient should benefit from training in stress management and relaxation.     Screening Interventions   Interventions Encouraged to exercise;Program counselor consult      Quality of Life Scores:     Quality of Life - 12/31/15 1335      Quality of Life Scores   Health/Function Pre 19.63 %   Socioeconomic Pre 19.13 %   Psych/Spiritual Pre 20.67 %   Family Pre 21 %   GLOBAL Pre 19.89 %      PHQ-9: Recent Review Flowsheet Data    Depression screen [facility] 2/9 12/31/2015   Decreased Interest 1   Down, Depressed, Hopeless 1   PHQ - 2 Score 2   Altered sleeping 3   Tired, decreased energy 3   Change in appetite 1   Feeling bad or failure about yourself  0   Trouble concentrating 1   Moving slowly or fidgety/restless 0   Suicidal thoughts 0   PHQ-9 Score 10   Difficult doing work/chores Very difficult      Psychosocial Evaluation and  Intervention:   Psychosocial Re-Evaluation:     Psychosocial Re-Evaluation    Row Name 01/20/16 1502 01/20/16 1503           Psychosocial Re-Evaluation   Comments Jeremy Wells last attended [facility] 01/13/16. I called today to check on him. He is very short of breath and was started on an antibiotic which caused nauseate and started on prednisone. He has an appointment with Dr Cherly Hensen 01/21/16. Jeremy Wells psychosocial assessment reveals no barriers at this time to participation in Pulmonary Rehab.  He  has good family and friend support that encourages Jeremy Wells to participate in [facility] and progress with His goals.  Jeremy Wells concerns are monitored, but he  has acknowledge that attending the program has helped to maintain quality life with improved mobility, self-care, and emotional and financial stability.  Jeremy Wells is commended for regular attendance and self-motivation to improve His pulmonary disease management. Jeremy Wells has been sick for 2 weeks, but after calling him today, he states he is planning to get back to [facility] and to "hold his spot"! He is enjoying the class and knows the benefits of attending [facility].         Education: Education Goals: Education classes will be provided on a weekly basis, covering required topics. Participant will state understanding/return demonstration of topics presented.  Learning Barriers/Preferences:  Learning Barriers/Preferences - 12/31/15 1329      Learning Barriers/Preferences   Learning Barriers None   Learning Preferences None      Education Topics: Initial Evaluation Education: - Verbal, written and demonstration of respiratory meds, RPE/PD scales, oximetry and breathing techniques. Instruction on use of nebulizers and MDIs: cleaning and proper use, rinsing mouth with steroid doses and importance of monitoring MDI activations. Flowsheet Row Pulmonary Rehab from 01/06/2016 in [facility] Cardiac and Pulmonary Rehab  Date  12/31/15  Educator  LB   Instruction Review Code  2- meets goals/outcomes      General Nutrition Guidelines/Fats and Fiber: -Group instruction provided by verbal, written material, models and posters to present the general guidelines for heart healthy nutrition. Gives an explanation and review of dietary fats and fiber.   Controlling Sodium/Reading Food Labels: -Group verbal and written material supporting the discussion of sodium use in heart healthy nutrition. Review and explanation with models, verbal and written materials for utilization of the food label.   Exercise Physiology & Risk Factors: - Group verbal and written instruction with models to review the exercise physiology of the cardiovascular system and associated critical values. Details cardiovascular disease risk factors and the goals associated with each risk factor.   Aerobic Exercise & Resistance Training: - Gives group verbal and written discussion on the health impact of inactivity. On the components of aerobic and resistive training programs and the benefits of this training and how to safely progress through these programs.   Flexibility, Balance, General Exercise Guidelines: - Provides group verbal and written instruction on the benefits of flexibility and balance training programs. Provides general exercise guidelines with specific guidelines to those with heart or lung disease. Demonstration and skill practice provided.   Stress Management: - Provides group verbal and written instruction about the health risks of elevated stress, cause of high stress, and healthy ways to reduce stress.   Depression: - Provides group verbal and written instruction on the correlation between heart/lung disease and depressed mood, treatment options, and the stigmas associated with seeking treatment. Flowsheet Row Pulmonary Rehab from 01/06/2016 in [facility] Cardiac and Pulmonary Rehab  Date  01/06/16  Educator  [facility]  Instruction Review Code  2- meets  goals/outcomes      Exercise & Equipment Safety: - Individual verbal instruction and demonstration of equipment use and safety with use of the equipment.   Infection Prevention: - Provides verbal and written material to individual with discussion of infection control including proper hand washing and proper equipment cleaning during exercise session. Flowsheet Row Pulmonary Rehab from 01/06/2016 in [facility] Cardiac and Pulmonary Rehab  Date  12/31/15  Educator  LB  Instruction Review Code  2- meets goals/outcomes      Falls Prevention: - Provides verbal and written material to individual with discussion of falls prevention and safety. Flowsheet Row Pulmonary Rehab from 01/06/2016 in [facility] Cardiac and Pulmonary Rehab  Date  12/31/15  Educator  LB  Instruction Review Code  2- meets goals/outcomes      Diabetes: - Individual verbal and written instruction to review signs/symptoms of diabetes, desired ranges of glucose level fasting, after meals and with exercise. Advice that pre and post exercise glucose checks will be done for 3 sessions at entry of program. Flowsheet Row Pulmonary Rehab from 01/06/2016 in [facility] Cardiac and Pulmonary Rehab  Date  01/06/16  Educator  LB  Instruction Review Code  2- meets goals/outcomes      Chronic Lung Diseases: - Group verbal and  written instruction to review new updates, new respiratory medications, new advancements in procedures and treatments. Provide informative websites and "800" numbers of self-education.   Lung Procedures: - Group verbal and written instruction to describe testing methods done to diagnose lung disease. Review the outcome of test results. Describe the treatment choices: Pulmonary Function Tests, ABGs and oximetry.   Energy Conservation: - Provide group verbal and written instruction for methods to conserve energy, plan and organize activities. Instruct on pacing techniques, use of adaptive equipment and posture/positioning to  relieve shortness of breath.   Triggers: - Group verbal and written instruction to review types of environmental controls: home humidity, furnaces, filters, dust mite/pet prevention, HEPA vacuums. To discuss weather changes, air quality and the benefits of nasal washing.   Exacerbations: - Group verbal and written instruction to provide: warning signs, infection symptoms, calling MD promptly, preventive modes, and value of vaccinations. Review: effective airway clearance, coughing and/or vibration techniques. Create an Sport and exercise psychologist.   Oxygen: - Individual and group verbal and written instruction on oxygen therapy. Includes supplement oxygen, available portable oxygen systems, continuous and intermittent flow rates, oxygen safety, concentrators, and Medicare reimbursement for oxygen. Flowsheet Row Pulmonary Rehab from 01/06/2016 in [facility] Cardiac and Pulmonary Rehab  Date  12/31/15  Educator  LB  Instruction Review Code  2- meets goals/outcomes      Respiratory Medications: - Group verbal and written instruction to review medications for lung disease. Drug class, frequency, complications, importance of spacers, rinsing mouth after steroid MDI's, and proper cleaning methods for nebulizers. Flowsheet Row Pulmonary Rehab from 01/06/2016 in [facility] Cardiac and Pulmonary Rehab  Date  12/31/15  Educator  LB  Instruction Review Code  2- meets goals/outcomes      AED/CPR: - Group verbal and written instruction with the use of models to demonstrate the basic use of the AED with the basic ABC's of resuscitation.   Breathing Retraining: - Provides individuals verbal and written instruction on purpose, frequency, and proper technique of diaphragmatic breathing and pursed-lipped breathing. Applies individual practice skills. Flowsheet Row Pulmonary Rehab from 01/06/2016 in [facility] Cardiac and Pulmonary Rehab  Date  01/30/16  Educator  LB  Instruction Review Code  2- meets goals/outcomes      Anatomy  and Physiology of the Lungs: - Group verbal and written instruction with the use of models to provide basic lung anatomy and physiology related to function, structure and complications of lung disease.   Heart Failure: - Group verbal and written instruction on the basics of heart failure: signs/symptoms, treatments, explanation of ejection fraction, enlarged heart and cardiomyopathy.   Sleep Apnea: - Individual verbal and written instruction to review Obstructive Sleep Apnea. Review of risk factors, methods for diagnosing and types of masks and machines for OSA.   Anxiety: - Provides group, verbal and written instruction on the correlation between heart/lung disease and anxiety, treatment options, and management of anxiety.   Relaxation: - Provides group, verbal and written instruction about the benefits of relaxation for patients with heart/lung disease. Also provides patients with examples of relaxation techniques.   Knowledge Questionnaire Score:     Knowledge Questionnaire Score - 12/31/15 1329      Knowledge Questionnaire Score   Pre Score 5/10       Core Components/Risk Factors/Patient Goals at Admission:     Personal Goals and Risk Factors at Admission - 12/31/15 1333      Core Components/Risk Factors/Patient Goals on Admission   Sedentary Yes   Intervention Provide advice,  education, support and counseling about physical activity/exercise needs.;Develop an individualized exercise prescription for aerobic and resistive training based on initial evaluation findings, risk stratification, comorbidities and participant's personal goals.   Expected Outcomes Achievement of increased cardiorespiratory fitness and enhanced flexibility, muscular endurance and strength shown through measurements of functional capacity and personal statement of participant.   Increase Strength and Stamina Yes   Intervention Provide advice, education, support and counseling about physical  activity/exercise needs.;Develop an individualized exercise prescription for aerobic and resistive training based on initial evaluation findings, risk stratification, comorbidities and participant's personal goals.   Expected Outcomes Achievement of increased cardiorespiratory fitness and enhanced flexibility, muscular endurance and strength shown through measurements of functional capacity and personal statement of participant.   Improve shortness of breath with ADL's Yes   Intervention Provide education, individualized exercise plan and daily activity instruction to help decrease symptoms of SOB with activities of daily living.   Expected Outcomes Short Term: Achieves a reduction of symptoms when performing activities of daily living.   Develop more efficient breathing techniques such as purse lipped breathing and diaphragmatic breathing; and practicing self-pacing with activity Yes   Intervention Provide education, demonstration and support about specific breathing techniuqes utilized for more efficient breathing. Include techniques such as pursed lipped breathing, diaphragmatic breathing and self-pacing activity.   Expected Outcomes Short Term: Participant will be able to demonstrate and use breathing techniques as needed throughout daily activities.   Increase knowledge of respiratory medications and ability to use respiratory devices properly  Yes   Intervention Provide education and demonstration as needed of appropriate use of medications, inhalers, and oxygen therapy.   Expected Outcomes Short Term: Achieves understanding of medications use. Understands that oxygen is a medication prescribed by physician. Demonstrates appropriate use of inhaler and oxygen therapy.   Diabetes Yes   Intervention Provide education about signs/symptoms and action to take for hypo/hyperglycemia.;Provide education about proper nutrition, including hydration, and aerobic/resistive exercise prescription along with  prescribed medications to achieve blood glucose in normal ranges: Fasting glucose 65-99 mg/dL   Expected Outcomes Short Term: Participant verbalizes understanding of the signs/symptoms and immediate care of hyper/hypoglycemia, proper foot care and importance of medication, aerobic/resistive exercise and nutrition plan for blood glucose control.;Long Term: Attainment of HbA1C < 7%.      Core Components/Risk Factors/Patient Goals Review:      Goals and Risk Factor Review    Row Name 01/15/16 1613             Core Components/Risk Factors/Patient Goals Review   Personal Goals Review Sedentary;Develop more efficient breathing techniques such as purse lipped breathing and diaphragmatic breathing and practicing self-pacing with activity.;Diabetes       Review Jeremy Wells has attended 2 sessions of [facility]. He has obtained his exercise goals with RPE 11 and PD 3-4. He adjusts his oxygen to 4l/m and has good PLB techique. His glucose was monitored with a 48 point drop, but acceptable glucose level of 168.       Expected Outcomes Continue attending [facility] for progression in strength and stamina and increased knowledge of COPD management.          Core Components/Risk Factors/Patient Goals at Discharge (Final Review):      Goals and Risk Factor Review - 01/15/16 1613      Core Components/Risk Factors/Patient Goals Review   Personal Goals Review Sedentary;Develop more efficient breathing techniques such as purse lipped breathing and diaphragmatic breathing and practicing self-pacing with activity.;Diabetes   Review Jeremy Wells  has attended 2 sessions of [facility]. He has obtained his exercise goals with RPE 11 and PD 3-4. He adjusts his oxygen to 4l/m and has good PLB techique. His glucose was monitored with a 48 point drop, but acceptable glucose level of 168.   Expected Outcomes Continue attending [facility] for progression in strength and stamina and increased knowledge of COPD management.       ITP Comments:     ITP Comments    Row Name 01/08/16 1522 01/20/16 1457 02/05/16 1506 02/18/16 1527     ITP Comments Jeremy Wells arrived today for [facility], but he was too nauseaed exercise. He hopes to return on Friday.  Jeremy Wells last attended [facility] 01/13/16 called today to check on him. He is very short of breath and has started on an antibiotic which caused nauseate and started on prednisone. He has an appointment with Dr Cherly Hensenhang 01/21/16. Called to let them know that Jeremy Wells has clearance to return to rehab.  They will try for Friday. Called to check on status of return.  Jeremy Wells stated that he did not wish to return to rehab and would like to be discharged from the program.       Comments: Discharge ITP

## 2016-07-05 ENCOUNTER — Emergency Department: Payer: Medicare Other

## 2016-07-05 ENCOUNTER — Encounter: Payer: Self-pay | Admitting: Emergency Medicine

## 2016-07-05 ENCOUNTER — Observation Stay
Admission: EM | Admit: 2016-07-05 | Discharge: 2016-07-07 | Disposition: A | Discharge status: Home / Self Care | Payer: Medicare Other | Attending: Specialist | Admitting: Specialist

## 2016-07-05 DIAGNOSIS — J439 Emphysema, unspecified: Principal | ICD-10-CM | POA: Insufficient documentation

## 2016-07-05 DIAGNOSIS — Z79899 Other long term (current) drug therapy: Secondary | ICD-10-CM | POA: Insufficient documentation

## 2016-07-05 DIAGNOSIS — Z7951 Long term (current) use of inhaled steroids: Secondary | ICD-10-CM | POA: Insufficient documentation

## 2016-07-05 DIAGNOSIS — K219 Gastro-esophageal reflux disease without esophagitis: Secondary | ICD-10-CM | POA: Diagnosis not present

## 2016-07-05 DIAGNOSIS — M545 Low back pain: Secondary | ICD-10-CM | POA: Diagnosis not present

## 2016-07-05 DIAGNOSIS — Z9981 Dependence on supplemental oxygen: Secondary | ICD-10-CM | POA: Diagnosis not present

## 2016-07-05 DIAGNOSIS — Z7952 Long term (current) use of systemic steroids: Secondary | ICD-10-CM | POA: Diagnosis not present

## 2016-07-05 DIAGNOSIS — J441 Chronic obstructive pulmonary disease with (acute) exacerbation: Secondary | ICD-10-CM | POA: Diagnosis present

## 2016-07-05 DIAGNOSIS — E119 Type 2 diabetes mellitus without complications: Secondary | ICD-10-CM | POA: Diagnosis not present

## 2016-07-05 DIAGNOSIS — Z7982 Long term (current) use of aspirin: Secondary | ICD-10-CM | POA: Diagnosis not present

## 2016-07-05 DIAGNOSIS — G8929 Other chronic pain: Secondary | ICD-10-CM | POA: Insufficient documentation

## 2016-07-05 DIAGNOSIS — Z96659 Presence of unspecified artificial knee joint: Secondary | ICD-10-CM | POA: Diagnosis not present

## 2016-07-05 DIAGNOSIS — Z79891 Long term (current) use of opiate analgesic: Secondary | ICD-10-CM | POA: Insufficient documentation

## 2016-07-05 DIAGNOSIS — Z7984 Long term (current) use of oral hypoglycemic drugs: Secondary | ICD-10-CM | POA: Diagnosis not present

## 2016-07-05 DIAGNOSIS — F17210 Nicotine dependence, cigarettes, uncomplicated: Secondary | ICD-10-CM | POA: Insufficient documentation

## 2016-07-05 DIAGNOSIS — J9610 Chronic respiratory failure, unspecified whether with hypoxia or hypercapnia: Secondary | ICD-10-CM | POA: Diagnosis not present

## 2016-07-05 DIAGNOSIS — R0602 Shortness of breath: Secondary | ICD-10-CM | POA: Diagnosis present

## 2016-07-05 HISTORY — DX: Emphysema, unspecified: J43.9

## 2016-07-05 HISTORY — DX: Other chronic pain: G89.29

## 2016-07-05 HISTORY — DX: Chronic obstructive pulmonary disease, unspecified: J44.9

## 2016-07-05 HISTORY — DX: Type 2 diabetes mellitus without complications: E11.9

## 2016-07-05 LAB — CBC
HEMATOCRIT: 39.5 % — AB (ref 40.0–52.0)
HEMOGLOBIN: 12.7 g/dL — AB (ref 13.0–18.0)
MCH: 29.1 pg (ref 26.0–34.0)
MCHC: 32.3 g/dL (ref 32.0–36.0)
MCV: 90.3 fL (ref 80.0–100.0)
Platelets: 139 10*3/uL — ABNORMAL LOW (ref 150–440)
RBC: 4.37 MIL/uL — ABNORMAL LOW (ref 4.40–5.90)
RDW: 14.1 % (ref 11.5–14.5)
WBC: 9.5 10*3/uL (ref 3.8–10.6)

## 2016-07-05 LAB — COMPREHENSIVE METABOLIC PANEL
ALK PHOS: 71 U/L (ref 38–126)
ALT: 20 U/L (ref 17–63)
ANION GAP: 8 (ref 5–15)
AST: 22 U/L (ref 15–41)
Albumin: 3.7 g/dL (ref 3.5–5.0)
BILIRUBIN TOTAL: 0.5 mg/dL (ref 0.3–1.2)
BUN: 16 mg/dL (ref 6–20)
CALCIUM: 8.7 mg/dL — AB (ref 8.9–10.3)
CO2: 32 mmol/L (ref 22–32)
CREATININE: 1.14 mg/dL (ref 0.61–1.24)
Chloride: 95 mmol/L — ABNORMAL LOW (ref 101–111)
GFR calc non Af Amer: >60 mL/min (ref >60)
Glucose, Bld: 334 mg/dL — ABNORMAL HIGH (ref 65–99)
Potassium: 4.7 mmol/L (ref 3.5–5.1)
Sodium: 135 mmol/L (ref 135–145)
TOTAL PROTEIN: 7.1 g/dL (ref 6.5–8.1)

## 2016-07-05 LAB — BLOOD GAS, VENOUS
ACID-BASE EXCESS: 6.9 mmol/L — AB (ref 0.0–2.0)
Bicarbonate: 36.1 mmol/L — ABNORMAL HIGH (ref 20.0–28.0)
FIO2: 0.28
O2 SAT: 85.6 %
PCO2 VEN: 75 mmHg — AB (ref 44.0–60.0)
PO2 VEN: 57 mmHg — AB (ref 32.0–45.0)
Patient temperature: 37
pH, Ven: 7.29 (ref 7.250–7.430)

## 2016-07-05 LAB — GLUCOSE, CAPILLARY
Glucose-Capillary: 251 mg/dL — ABNORMAL HIGH (ref 65–99)
Glucose-Capillary: 201 mg/dL — ABNORMAL HIGH (ref 65–99)

## 2016-07-05 LAB — TROPONIN I: Troponin I: <0.03 ng/mL (ref <0.03)

## 2016-07-05 MED ORDER — ALBUTEROL SULFATE (2.5 MG/3ML) 0.083% IN NEBU
5.0000 mg | INHALATION_SOLUTION | Freq: Once | RESPIRATORY_TRACT | Status: AC
  Administered 2016-07-05: 5 mg via RESPIRATORY_TRACT
  Filled 2016-07-05: qty 6

## 2016-07-05 MED ORDER — INSULIN ASPART 100 UNIT/ML ~~LOC~~ SOLN
0.0000 [IU] | Freq: Three times a day (TID) | SUBCUTANEOUS | Status: DC
  Administered 2016-07-05 – 2016-07-06 (×2): 5 [IU] via SUBCUTANEOUS
  Administered 2016-07-06: 13:00:00 2 [IU] via SUBCUTANEOUS
  Administered 2016-07-07: 12:00:00 5 [IU] via SUBCUTANEOUS
  Administered 2016-07-07: 7 [IU] via SUBCUTANEOUS
  Filled 2016-07-05: qty 2
  Filled 2016-07-05: qty 5
  Filled 2016-07-05: qty 2
  Filled 2016-07-05: qty 7
  Filled 2016-07-05 (×2): qty 5

## 2016-07-05 MED ORDER — METHYLPREDNISOLONE SODIUM SUCC 125 MG IJ SOLR: 60.0000 mg | INTRAMUSCULAR | Status: DC

## 2016-07-05 MED ORDER — GUAIFENESIN ER 600 MG PO TB12
600.0000 mg | ORAL_TABLET | Freq: Two times a day (BID) | ORAL | Status: DC
  Administered 2016-07-05 – 2016-07-07 (×4): 600 mg via ORAL
  Filled 2016-07-05 (×4): qty 1

## 2016-07-05 MED ORDER — MOMETASONE FURO-FORMOTEROL FUM 200-5 MCG/ACT IN AERO
2.0000 | INHALATION_SPRAY | Freq: Two times a day (BID) | RESPIRATORY_TRACT | Status: DC
  Administered 2016-07-05 – 2016-07-06 (×2): 2 via RESPIRATORY_TRACT
  Filled 2016-07-05: qty 8.8

## 2016-07-05 MED ORDER — IPRATROPIUM-ALBUTEROL 0.5-2.5 (3) MG/3ML IN SOLN
3.0000 mL | Freq: Once | RESPIRATORY_TRACT | Status: AC
  Administered 2016-07-05: 3 mL via RESPIRATORY_TRACT

## 2016-07-05 MED ORDER — ORAL CARE MOUTH RINSE
15.0000 mL | Freq: Two times a day (BID) | OROMUCOSAL | Status: DC
  Administered 2016-07-05 – 2016-07-07 (×3): 15 mL via OROMUCOSAL

## 2016-07-05 MED ORDER — GLIPIZIDE 10 MG PO TABS
10.0000 mg | ORAL_TABLET | Freq: Two times a day (BID) | ORAL | Status: DC
  Administered 2016-07-05: 19:00:00 10 mg via ORAL
  Filled 2016-07-05: qty 1

## 2016-07-05 MED ORDER — CEFTRIAXONE SODIUM IN DEXTROSE 20 MG/ML IV SOLN
1.0000 g | Freq: Once | INTRAVENOUS | Status: DC
  Filled 2016-07-05 (×2): qty 50

## 2016-07-05 MED ORDER — ONDANSETRON HCL 4 MG/2ML IJ SOLN: 4.0000 mg | Freq: Four times a day (QID) | INTRAMUSCULAR | Status: DC | PRN

## 2016-07-05 MED ORDER — LEVOFLOXACIN IN D5W 500 MG/100ML IV SOLN
500.0000 mg | INTRAVENOUS | Status: DC
  Administered 2016-07-06: 19:00:00 500 mg via INTRAVENOUS
  Filled 2016-07-05 (×2): qty 100

## 2016-07-05 MED ORDER — ALBUTEROL SULFATE (2.5 MG/3ML) 0.083% IN NEBU
5.0000 mg | INHALATION_SOLUTION | Freq: Once | RESPIRATORY_TRACT | Status: AC
  Administered 2016-07-05: 5 mg via RESPIRATORY_TRACT

## 2016-07-05 MED ORDER — IPRATROPIUM-ALBUTEROL 0.5-2.5 (3) MG/3ML IN SOLN
RESPIRATORY_TRACT | Status: AC
  Filled 2016-07-05: qty 6

## 2016-07-05 MED ORDER — MORPHINE SULFATE ER 15 MG PO TBCR
30.0000 mg | EXTENDED_RELEASE_TABLET | Freq: Three times a day (TID) | ORAL | Status: DC
  Administered 2016-07-05 – 2016-07-07 (×5): 30 mg via ORAL
  Filled 2016-07-05 (×5): qty 2

## 2016-07-05 MED ORDER — AZITHROMYCIN 500 MG PO TABS
500.0000 mg | ORAL_TABLET | Freq: Once | ORAL | Status: AC
  Administered 2016-07-05: 500 mg via ORAL
  Filled 2016-07-05: qty 1

## 2016-07-05 MED ORDER — ACETAMINOPHEN 650 MG RE SUPP: 650.0000 mg | Freq: Four times a day (QID) | RECTAL | Status: DC | PRN

## 2016-07-05 MED ORDER — PANTOPRAZOLE SODIUM 40 MG PO TBEC
40.0000 mg | DELAYED_RELEASE_TABLET | Freq: Every day | ORAL | Status: DC
  Administered 2016-07-05 – 2016-07-07 (×3): 40 mg via ORAL
  Filled 2016-07-05 (×3): qty 1

## 2016-07-05 MED ORDER — INSULIN ASPART 100 UNIT/ML ~~LOC~~ SOLN
0.0000 [IU] | Freq: Every day | SUBCUTANEOUS | Status: DC
  Administered 2016-07-05 – 2016-07-06 (×2): 2 [IU] via SUBCUTANEOUS
  Filled 2016-07-05: qty 2

## 2016-07-05 MED ORDER — ENOXAPARIN SODIUM 40 MG/0.4ML ~~LOC~~ SOLN
40.0000 mg | SUBCUTANEOUS | Status: DC
  Administered 2016-07-05 – 2016-07-06 (×3): 40 mg via SUBCUTANEOUS
  Filled 2016-07-05 (×3): qty 0.4

## 2016-07-05 MED ORDER — ASPIRIN 81 MG PO CHEW
81.0000 mg | CHEWABLE_TABLET | Freq: Every day | ORAL | Status: DC
  Administered 2016-07-05 – 2016-07-07 (×3): 81 mg via ORAL
  Filled 2016-07-05 (×3): qty 1

## 2016-07-05 MED ORDER — MAGNESIUM SULFATE 2 GM/50ML IV SOLN
2.0000 g | Freq: Once | INTRAVENOUS | Status: AC
  Administered 2016-07-05: 2 g via INTRAVENOUS
  Filled 2016-07-05: qty 50

## 2016-07-05 MED ORDER — ONDANSETRON HCL 4 MG PO TABS: 4.0000 mg | ORAL_TABLET | Freq: Four times a day (QID) | ORAL | Status: DC | PRN

## 2016-07-05 MED ORDER — ACETAMINOPHEN 325 MG PO TABS: 650.0000 mg | ORAL_TABLET | Freq: Four times a day (QID) | ORAL | Status: DC | PRN

## 2016-07-05 MED ORDER — METFORMIN HCL 500 MG PO TABS
1000.0000 mg | ORAL_TABLET | Freq: Two times a day (BID) | ORAL | Status: DC
  Administered 2016-07-05 – 2016-07-07 (×4): 1000 mg via ORAL
  Filled 2016-07-05 (×4): qty 2

## 2016-07-05 MED ORDER — METHYLPREDNISOLONE SODIUM SUCC 125 MG IJ SOLR
125.0000 mg | Freq: Once | INTRAMUSCULAR | Status: AC
  Administered 2016-07-05: 125 mg via INTRAVENOUS
  Filled 2016-07-05: qty 2

## 2016-07-05 MED ORDER — DEXTROSE 5 % IV SOLN
1.0000 g | Freq: Once | INTRAVENOUS | Status: AC
  Administered 2016-07-05: 1 g via INTRAVENOUS
  Filled 2016-07-05: qty 10

## 2016-07-05 MED ORDER — MONTELUKAST SODIUM 10 MG PO TABS
10.0000 mg | ORAL_TABLET | Freq: Every day | ORAL | Status: DC
  Administered 2016-07-05 – 2016-07-06 (×2): 10 mg via ORAL
  Filled 2016-07-05 (×2): qty 1

## 2016-07-05 MED ORDER — SODIUM CHLORIDE 0.9 % IV BOLUS (SEPSIS)
1000.0000 mL | Freq: Once | INTRAVENOUS | Status: AC
  Administered 2016-07-05: 1000 mL via INTRAVENOUS

## 2016-07-05 MED ORDER — NICOTINE 14 MG/24HR TD PT24
14.0000 mg | MEDICATED_PATCH | Freq: Every day | TRANSDERMAL | Status: DC
  Administered 2016-07-05 – 2016-07-07 (×3): 14 mg via TRANSDERMAL
  Filled 2016-07-05 (×3): qty 1

## 2016-07-05 MED ORDER — IPRATROPIUM-ALBUTEROL 0.5-2.5 (3) MG/3ML IN SOLN
3.0000 mL | RESPIRATORY_TRACT | Status: DC
  Administered 2016-07-05 – 2016-07-07 (×11): 3 mL via RESPIRATORY_TRACT
  Filled 2016-07-05 (×10): qty 3

## 2016-07-05 MED ORDER — SODIUM CHLORIDE 0.9% FLUSH: 3.0000 mL | INTRAVENOUS | Status: DC | PRN

## 2016-07-05 MED ORDER — SODIUM CHLORIDE 0.9% FLUSH
3.0000 mL | Freq: Two times a day (BID) | INTRAVENOUS | Status: DC
  Administered 2016-07-05 – 2016-07-07 (×4): 3 mL via INTRAVENOUS

## 2016-07-05 NOTE — ED Provider Notes (Signed)
[facility] Emergency Department Provider Note  ____________________________________________  Time seen: Approximately 2:55 PM  I have reviewed the triage vital signs and the nursing notes.   HISTORY  Chief Complaint Shortness of Breath  Level 5 caveat:  Portions of the history and physical were unable to be obtained due to the patient's poor historian   HPI Jeremy ESSON Sr. is a 76 y.o. male who complains ofshortness of breath for the past month, worsening. Also reports a cough productive of clear sputum also over the past month. Notes dyspnea on exertion. 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 Patient also complains of left lateral chest wall pain. He points to specific spot where it hurts to press and reproduce the pain. It's sharp lasting for a few seconds at a time nonradiating without alleviating factors. Hurts to cough. No hemoptysis.     Past Medical History:  Diagnosis Date  . Chronic pain   . COPD (chronic obstructive pulmonary disease) (HCC)   . Diabetes mellitus without complication (HCC)   . Emphysema/COPD (HCC)      There are no active problems to display for this patient.    Past Surgical History:  Procedure Laterality Date  . REPLACEMENT TOTAL KNEE       Prior to Admission medications   Not on File     Allergies Patient has no known allergies.   History reviewed. No pertinent family history.  Social History Social History  Substance Use Topics  . Smoking status: Current Every Day Smoker    Packs/day: 0.50    Types: Cigarettes  . Smokeless tobacco: Never Used  . Alcohol use No    Review of Systems  Constitutional:   No fever or chills.  ENT:   No sore throat. No rhinorrhea. Cardiovascular:   No chest pain or syncope. Respiratory:   Positive shortness of breath and cough as above. Positive chest wall pain as above. Gastrointestinal:    Negative for abdominal pain, vomiting and diarrhea.  Musculoskeletal:   Negative for focal pain or swelling All other systems reviewed and are negative except as documented above in ROS and HPI.  ____________________________________________   PHYSICAL EXAM:  VITAL SIGNS: ED Triage Vitals  Enc Vitals Group     BP 07/05/16 1348 125/65     Pulse Rate 07/05/16 1348 (!) 101     Resp 07/05/16 1348 (!) 26     Temp --      Temp src --      SpO2 07/05/16 1348 98 %     Weight 07/05/16 1357 206 lb (93.4 kg)     Height 07/05/16 1357 5\' 10"  (1.778 m)     Head Circumference --      Peak Flow --      Pain Score 07/05/16 1414 5     Pain Loc --      Pain Edu? --      Excl. in GC? --     Vital signs reviewed, nursing assessments reviewed.   Constitutional:   Alert and oriented. Uncomfortable, mild respiratory distress. Eyes:   No scleral icterus.  EOMI. No nystagmus. No conjunctival pallor. PERRL. ENT   Head:   Normocephalic and atraumatic.   Nose:   No congestion/rhinnorhea.    Mouth/Throat:   Dry mucous membranes, no pharyngeal erythema. No peritonsillar mass.    Neck:   No meningismus. Full ROM Hematological/Lymphatic/Immunilogical:   No  cervical lymphadenopathy. Cardiovascular:   Tachycardia heart rate 100. Symmetric bilateral radial and DP pulses.  No murmurs.  Respiratory:   Tachypnea, accessory muscle use. Prolonged expiratory phase. Diffuse expiratory wheezing. No focal crackles. Chest wall is tender to the touch reproducing his pain in the anterior axillary line around the sixth rib interspace.. Gastrointestinal:   Soft and nontender. Non distended. There is no CVA tenderness.  No rebound, rigidity, or guarding. Genitourinary:   deferred Musculoskeletal:   Normal range of motion in all extremities. No joint effusions.  No lower extremity tenderness.  No edema. Neurologic:   Normal speech and language.  Motor grossly intact. No gross focal neurologic deficits are  appreciated.  Skin:    Skin is warm, dry and intact. No rash noted.  No petechiae, purpura, or bullae.  ____________________________________________    LABS (pertinent positives/negatives) (all labs ordered are listed, but only abnormal results are displayed) Labs Reviewed  CBC - Abnormal; Notable for the following:       Result Value   RBC 4.37 (*)    Hemoglobin 12.7 (*)    HCT 39.5 (*)    Platelets 139 (*)    All other components within normal limits  COMPREHENSIVE METABOLIC PANEL - Abnormal; Notable for the following:    Chloride 95 (*)    Glucose, Bld 334 (*)    Calcium 8.7 (*)    All other components within normal limits  TROPONIN I   ____________________________________________   EKG  Interpreted by me Sinus tachycardia rate 104, right axis, right bundle branch block. No acute ischemic changes.  ____________________________________________    RADIOLOGY  Dg Chest 2 View  Result Date: 07/05/2016 CLINICAL DATA:  Patient with shortness of breath.  Wheezing. EXAM: CHEST  2 VIEW COMPARISON:  Chest radiograph 09/22/2013 FINDINGS: Stable cardiac and mediastinal contours. No consolidative pulmonary opacities. No pleural effusion or pneumothorax. Thoracic spine degenerative changes. Pulmonary hyperinflation. IMPRESSION: Pulmonary hyperinflation.  No acute cardiopulmonary process. Cardiomegaly. Electronically Signed   By: Annia Beltrew  Davis M.D.   On: 07/05/2016 14:24    ____________________________________________   PROCEDURES Procedures  ____________________________________________   INITIAL IMPRESSION / ASSESSMENT AND PLAN / ED COURSE  Pertinent labs & imaging results that were available during my care of the patient were reviewed by me and considered in my medical decision making (see chart for details).  Patient presents with shortness of breath and cough. Subacute to chronic exam is currently indicative of a COPD exacerbation. Low suspicion of ACS PE dissection. Low  suspicion of pneumonia or sepsis. Patient is already received bronchodilators without adequate improvement. I'll give him magnesium sulfate, Solu-Medrol, IV saline, and a further round of albuterol nebs. We'll reassess for improvement. If not much improved, patient may require hospitalization. Care of the patient be signed out to Dr. Sharma CovertNorman at 3 PM for reassessment.      ____________________________________________   FINAL CLINICAL IMPRESSION(S) / ED DIAGNOSES  Final diagnoses:  COPD exacerbation (HCC)      New Prescriptions   No medications on file     Portions of this note were generated with dragon dictation software. Dictation errors may occur despite best attempts at proofreading.    Sharman CheekStafford, Aalivia Mcgraw, MD 07/05/16 1501

## 2016-07-05 NOTE — H&P (Signed)
[facility] [place] at [facility]   PATIENT NAME: Jeremy Wells    MR#:  [id number]  DATE OF BIRTH:  03-21-1940  DATE OF ADMISSION:  07/05/2016  PRIMARY CARE PHYSICIAN: Patient, No Pcp Per   REQUESTING/REFERRING PHYSICIAN: Dr. Rockne Menghini  CHIEF COMPLAINT:   Chief Complaint  Patient presents with  . Shortness of Breath    HISTORY OF PRESENT ILLNESS:  Jeremy Wells  is a 76 y.o. male with a known history of Chronic respiratory failure secondary to COPD on 4 L home oxygen, diabetes mellitus, chronic pain on morphine, arthritis, ongoing smoking presents to hospital secondary to worsening shortness of breath. Patient continues to smoke about half pack every day in spite of family insisting him not to do that. He takes off his oxygen to smoke. He says he has been around his sick brother who had upper respiratory and infection. Patient's cough has become more thick, productive and worse in the last couple of days and his breathing has gotten much worse. Is chronically on 4 L home oxygen. He presented to the emergency room, had scant breath sounds received steroids and nebulizer treatments. He was supposed to be discharged from the ER, however he became tachypneic and ABG shows increased CO2 with some respiratory acidosis. So is being admitted for the same.  PAST MEDICAL HISTORY:   Past Medical History:  Diagnosis Date  . Chronic pain   . COPD (chronic obstructive pulmonary disease) (HCC)    on 4L home oxygen  . Diabetes mellitus without complication (HCC)   . Emphysema/COPD [facility])     PAST SURGICAL HISTORY:   Past Surgical History:  Procedure Laterality Date  . REPLACEMENT TOTAL KNEE      SOCIAL HISTORY:   Social History  Substance Use Topics  . Smoking status: Current Every Day Smoker    Packs/day: 0.50    Types: Cigarettes  . Smokeless tobacco: Never Used  . Alcohol use No    FAMILY HISTORY:   Family History  Problem Relation Age of  Onset  . CVA Father     DRUG ALLERGIES:  No Known Allergies  REVIEW OF SYSTEMS:   Review of Systems  Constitutional: Negative for chills, fever, malaise/fatigue and weight loss.  HENT: Negative for ear discharge, ear pain, hearing loss and nosebleeds.   Eyes: Negative for blurred vision, double vision and photophobia.  Respiratory: Positive for cough, shortness of breath and wheezing. Negative for hemoptysis.   Cardiovascular: Positive for orthopnea. Negative for chest pain, palpitations and leg swelling.  Gastrointestinal: Negative for abdominal pain, constipation, diarrhea, melena, nausea and vomiting.  Genitourinary: Negative for dysuria, frequency and urgency.  Musculoskeletal: Negative for back pain, myalgias and neck pain.  Skin: Negative for rash.  Neurological: Negative for dizziness, sensory change, speech change, focal weakness and headaches.  Endo/Heme/Allergies: Does not bruise/bleed easily.  Psychiatric/Behavioral: Negative for depression.    MEDICATIONS AT HOME:   Prior to Admission medications   Not on File      VITAL SIGNS:  Blood pressure 128/76, pulse 100, resp. rate (!) 22, height 5\' 10"  (1.778 m), weight 93.4 kg (206 lb), SpO2 98 %.  PHYSICAL EXAMINATION:   Physical Exam  GENERAL:  76 y.o.-year-old patient lying in the bed with no acute distress.  EYES: Pupils equal, round, reactive to light and accommodation. No scleral icterus. Extraocular muscles intact.  HEENT: Head atraumatic, normocephalic. Oropharynx and nasopharynx clear.  NECK:  Supple, no jugular venous distention. No thyroid enlargement, no  tenderness.  LUNGS: scant breath sounds bilaterally, scattered wheezing, no rales,rhonchi or crepitation. No use of accessory muscles of respiration.  CARDIOVASCULAR: S1, S2 normal. No murmurs, rubs, or gallops.  ABDOMEN: Soft, nontender, nondistended. Bowel sounds present. No organomegaly or mass.  EXTREMITIES: No pedal edema, cyanosis, or clubbing.    NEUROLOGIC: Cranial nerves II through XII are intact. Muscle strength 5/5 in all extremities. Sensation intact. Gait not checked.  PSYCHIATRIC: The patient is alert and oriented x 3.  SKIN: No obvious rash, lesion, or ulcer.   LABORATORY PANEL:   CBC  Recent Labs Lab 07/05/16 1401  WBC 9.5  HGB 12.7*  HCT 39.5*  PLT 139*   ------------------------------------------------------------------------------------------------------------------  Chemistries   Recent Labs Lab 07/05/16 1401  NA 135  K 4.7  CL 95*  CO2 32  GLUCOSE 334*  BUN 16  CREATININE 1.14  CALCIUM 8.7*  AST 22  ALT 20  ALKPHOS 71  BILITOT 0.5   ------------------------------------------------------------------------------------------------------------------  Cardiac Enzymes  Recent Labs Lab 07/05/16 1401  TROPONINI <0.03   ------------------------------------------------------------------------------------------------------------------  RADIOLOGY:  Dg Chest 2 View  Result Date: 07/05/2016 CLINICAL DATA:  Patient with shortness of breath.  Wheezing. EXAM: CHEST  2 VIEW COMPARISON:  Chest radiograph 09/22/2013 FINDINGS: Stable cardiac and mediastinal contours. No consolidative pulmonary opacities. No pleural effusion or pneumothorax. Thoracic spine degenerative changes. Pulmonary hyperinflation. IMPRESSION: Pulmonary hyperinflation.  No acute cardiopulmonary process. Cardiomegaly. Electronically Signed   By: Annia Beltrew  Davis M.D.   On: 07/05/2016 14:24    EKG:   Orders placed or performed during the hospital encounter of 07/05/16  . ED EKG  . ED EKG  . EKG 12-Lead  . EKG 12-Lead    IMPRESSION AND PLAN:   Joline SaltJackie Arey  is a 76 y.o. male with a known history of Chronic respiratory failure secondary to COPD on 4 L home oxygen, diabetes mellitus, chronic pain on morphine, arthritis, ongoing smoking presents to hospital secondary to worsening shortness of breath.  #1 acute on chronic COPD  exacerbation-remains on 4 L oxygen at this time. -PCO2 elevated with some respiratory acidosis. We'll start BiPAP at bedtime. -Continue daily steroids, DuoNeb's and continue his inhalers. -Advised again to quit smoking. Levaquin added for bronchitis symptoms. -Continue his Singulair.  #2 diabetes mellitus-check A1c. On glipizide and metformin at home. Also on chronic steroids at home. -On sliding scale insulin here.  #3 chronic low back pain-follows with pain management clinic. On morphine 3 times a day scheduled, will continue that.  #4 GERD-on Protonix.  #5 DVT prophylaxis-Lovenox   All the records are reviewed and case discussed with ED provider. Management plans discussed with the patient, family and they are in agreement.  CODE STATUS: Full code  TOTAL TIME TAKING CARE OF THIS PATIENT: 50 minutes.    Maigan Bittinger M.D on 07/05/2016 at 4:40 PM  Between 7am to 6pm - Pager - [phone]  After 6pm go to [email or web address] - password [email or web address]  [facility] [place] [facility]  Office  [phone]  CC: Primary care physician; Patient, No Pcp Per

## 2016-07-05 NOTE — Progress Notes (Signed)
Admitted from ED with COPD excaberation with respiratory failure. Sitting on side of bed with SOB and faint audible wheeze in no acute distress. Family at bedside. Nicotine patch applied with education done per pt request. SEE admission profile/assessment. Very DOE when up to BR upon return. Urinal placed at bedside. Available meds given as ordered. SSI given for FSBS 251.

## 2016-07-05 NOTE — ED Triage Notes (Addendum)
Pt presents to ED c/o SOB. Increased WOB noted, wheezing bilaterally. Hx COPD/emphysema. States he used 2 albuterol treatments at home without relief. Current smoker. Pt brought directly from vehicle to triage room.

## 2016-07-05 NOTE — Progress Notes (Signed)
ANTIBIOTIC CONSULT NOTE - INITIAL  Pharmacy Consult for Levaquin  Indication: acute bronchitis  No Known Allergies  Patient Measurements: Height: 5\' 10"  (177.8 cm) Weight: 206 lb (93.4 kg) IBW/kg (Calculated) : 73 Adjusted Body Weight:   Vital Signs: Temp: 97.9 F (36.6 C) (06/03 1823) Temp Source: Oral (06/03 1823) BP: 133/72 (06/03 1823) Pulse Rate: 101 (06/03 1823) Intake/Output from previous day: No intake/output data recorded. Intake/Output from this shift: No intake/output data recorded.  Labs:  Recent Labs  07/05/16 1401  WBC 9.5  HGB 12.7*  PLT 139*  CREATININE 1.14   Estimated Creatinine Clearance: 64.3 mL/min (by C-G formula based on SCr of 1.14 mg/dL). No results for input(s): VANCOTROUGH, VANCOPEAK, VANCORANDOM, GENTTROUGH, GENTPEAK, GENTRANDOM, TOBRATROUGH, TOBRAPEAK, TOBRARND, AMIKACINPEAK, AMIKACINTROU, AMIKACIN in the last 72 hours.   Microbiology: No results found for this or any previous visit (from the past 720 hour(s)).  Medical History: Past Medical History:  Diagnosis Date  . Chronic pain   . COPD (chronic obstructive pulmonary disease) (HCC)    on 4L home oxygen  . Diabetes mellitus without complication (HCC)   . Emphysema/COPD [facility](HCC)     Medications:  Prescriptions Prior to Admission  Medication Sig Dispense Refill Last Dose  . albuterol (PROVENTIL HFA;VENTOLIN HFA) 108 (90 Base) MCG/ACT inhaler Inhale 1-2 puffs into the lungs every 4 (four) hours as needed for wheezing or shortness of breath.   prn at prn  . albuterol (PROVENTIL) (2.5 MG/3ML) 0.083% nebulizer solution Take 2.5 mg by nebulization every 4 (four) hours as needed for wheezing or shortness of breath.   prn at prn  . aspirin EC 81 MG tablet Take 81 mg by mouth daily.   07/05/2016 at am  . Fluticasone-Salmeterol (ADVAIR DISKUS) 250-50 MCG/DOSE AEPB Inhale 1 puff into the lungs 2 (two) times daily.   07/05/2016 at am  . glipiZIDE (GLUCOTROL) 10 MG tablet Take 10 mg by mouth 2 (two)  times daily before a meal.   07/05/2016 at am  . metFORMIN (GLUCOPHAGE) 1000 MG tablet Take 1,000 mg by mouth 2 (two) times daily with a meal.   07/05/2016 at am  . montelukast (SINGULAIR) 10 MG tablet Take 10 mg by mouth at bedtime.   07/04/2016 at qhs  . morphine (MSIR) 30 MG tablet Take 30 mg by mouth 3 (three) times daily.   07/05/2016 at am  . omeprazole (PRILOSEC) 20 MG capsule Take 20 mg by mouth daily.   07/05/2016 at am  . predniSONE (DELTASONE) 10 MG tablet Take 1 mg by mouth daily with breakfast.   07/05/2016 at am  . tiotropium (SPIRIVA) 18 MCG inhalation capsule Place 18 mcg into inhaler and inhale as needed.   prn at prn   Assessment: CrCl = 64.3 ml/min  Goal of Therapy:  resolution of infection  Plan:  Expected duration 7 days with resolution of temperature and/or normalization of WBC   Azithromycin 500 mg IV X 1 given on 6/3 @ 15:33.  Will start Levaquin 500 mg IV Q24H on 6/4 @ 18:00.   Tyeler Goedken D 07/05/2016,7:02 PM

## 2016-07-05 NOTE — Care Management Obs Status (Signed)
MEDICARE OBSERVATION STATUS NOTIFICATION   Patient Details  Name: Jeremy PlanasJackie N Koo Sr. MRN: [id number] Date of Birth: 11-16-1940   Medicare Observation Status Notification Given:  Yes (COPD)    Caren MacadamMichelle Kesha Hurrell, RN 07/05/2016, 5:33 PM

## 2016-07-05 NOTE — ED Notes (Signed)
Patient transported to radiology

## 2016-07-05 NOTE — ED Triage Notes (Signed)
Pt reports pain across bil lower chest with cough. +white sputum.

## 2016-07-06 LAB — BASIC METABOLIC PANEL
ANION GAP: 9 (ref 5–15)
BUN: 18 mg/dL (ref 6–20)
CALCIUM: 8.8 mg/dL — AB (ref 8.9–10.3)
CO2: 33 mmol/L — AB (ref 22–32)
CREATININE: 1.22 mg/dL (ref 0.61–1.24)
Chloride: 96 mmol/L — ABNORMAL LOW (ref 101–111)
GFR calc Af Amer: >60 mL/min (ref >60)
GFR, EST NON AFRICAN AMERICAN: 56 mL/min — AB (ref >60)
Glucose, Bld: 155 mg/dL — ABNORMAL HIGH (ref 65–99)
Potassium: 5.1 mmol/L (ref 3.5–5.1)
SODIUM: 138 mmol/L (ref 135–145)

## 2016-07-06 LAB — GLUCOSE, CAPILLARY
GLUCOSE-CAPILLARY: 160 mg/dL — AB (ref 65–99)
Glucose-Capillary: 116 mg/dL — ABNORMAL HIGH (ref 65–99)
Glucose-Capillary: 270 mg/dL — ABNORMAL HIGH (ref 65–99)
Glucose-Capillary: 228 mg/dL — ABNORMAL HIGH (ref 65–99)

## 2016-07-06 LAB — CBC
HCT: 36.9 % — ABNORMAL LOW (ref 40.0–52.0)
HEMOGLOBIN: 12 g/dL — AB (ref 13.0–18.0)
MCH: 29.1 pg (ref 26.0–34.0)
MCHC: 32.5 g/dL (ref 32.0–36.0)
MCV: 89.7 fL (ref 80.0–100.0)
PLATELETS: 130 10*3/uL — AB (ref 150–440)
RBC: 4.12 MIL/uL — ABNORMAL LOW (ref 4.40–5.90)
RDW: 14 % (ref 11.5–14.5)
WBC: 8.5 10*3/uL (ref 3.8–10.6)

## 2016-07-06 MED ORDER — BUDESONIDE 0.5 MG/2ML IN SUSP
0.5000 mg | Freq: Two times a day (BID) | RESPIRATORY_TRACT | Status: DC
  Administered 2016-07-06 – 2016-07-07 (×3): 0.5 mg via RESPIRATORY_TRACT
  Filled 2016-07-06 (×3): qty 2

## 2016-07-06 MED ORDER — MORPHINE SULFATE (CONCENTRATE) 10 MG/0.5ML PO SOLN: 10.0000 mg | ORAL | Status: DC | PRN

## 2016-07-06 MED ORDER — METHYLPREDNISOLONE SODIUM SUCC 125 MG IJ SOLR
60.0000 mg | Freq: Four times a day (QID) | INTRAMUSCULAR | Status: DC
  Administered 2016-07-06 – 2016-07-07 (×4): 60 mg via INTRAVENOUS
  Filled 2016-07-06 (×5): qty 2

## 2016-07-06 NOTE — Progress Notes (Signed)
[facility] - [place] at [facility]   PATIENT NAME: Jeremy Wells    MR#:  [id number]  DATE OF BIRTH:  December 16, 1940  SUBJECTIVE:   Patient here due to shortness of breath and noted to be in COPD exacerbation. Still having significant wheezing and exertional shortness of breath.  REVIEW OF SYSTEMS:    Review of Systems  Constitutional: Negative for chills and fever.  HENT: Negative for congestion and tinnitus.   Eyes: Negative for blurred vision and double vision.  Respiratory: Positive for cough, shortness of breath and wheezing.   Cardiovascular: Negative for chest pain, orthopnea and PND.  Gastrointestinal: Negative for abdominal pain, diarrhea, nausea and vomiting.  Genitourinary: Negative for dysuria and hematuria.  Neurological: Negative for dizziness, sensory change and focal weakness.  All other systems reviewed and are negative.   Nutrition: Heart Healthy/Carb modified Tolerating Diet: Yes Tolerating PT: Await Eval.    DRUG ALLERGIES:  No Known Allergies  VITALS:  Blood pressure (!) 122/58, pulse 91, temperature 98.1 F (36.7 C), temperature source Axillary, resp. rate 20, height 5\' 10"  (1.778 m), weight 93.4 kg (206 lb), SpO2 100 %.  PHYSICAL EXAMINATION:   Physical Exam  GENERAL:  76 y.o.-year-old patient lying in bed in mild Resp. Distress.  EYES: Pupils equal, round, reactive to light and accommodation. No scleral icterus. Extraocular muscles intact.  HEENT: Head atraumatic, normocephalic. Oropharynx and nasopharynx clear.  NECK:  Supple, no jugular venous distention. No thyroid enlargement, no tenderness.  LUNGS: Prolonged inspiratory and expiratory phase, diffuse inspiratory and extremity wheezing. Positive use of accessory muscles. CARDIOVASCULAR: S1, S2 normal. No murmurs, rubs, or gallops.  ABDOMEN: Soft, nontender, nondistended. Bowel sounds present. No organomegaly or mass.  EXTREMITIES: No cyanosis, clubbing or edema b/l.     NEUROLOGIC: Cranial nerves II through XII are intact. No focal Motor or sensory deficits b/l.   PSYCHIATRIC: The patient is alert and oriented x 3.  SKIN: No obvious rash, lesion, or ulcer.    LABORATORY PANEL:   CBC  Recent Labs Lab 07/06/16 0456  WBC 8.5  HGB 12.0*  HCT 36.9*  PLT 130*   ------------------------------------------------------------------------------------------------------------------  Chemistries   Recent Labs Lab 07/05/16 1401 07/06/16 0456  NA 135 138  K 4.7 5.1  CL 95* 96*  CO2 32 33*  GLUCOSE 334* 155*  BUN 16 18  CREATININE 1.14 1.22  CALCIUM 8.7* 8.8*  AST 22  --   ALT 20  --   ALKPHOS 71  --   BILITOT 0.5  --    ------------------------------------------------------------------------------------------------------------------  Cardiac Enzymes  Recent Labs Lab 07/05/16 1401  TROPONINI <0.03   ------------------------------------------------------------------------------------------------------------------  RADIOLOGY:  Dg Chest 2 View  Result Date: 07/05/2016 CLINICAL DATA:  Patient with shortness of breath.  Wheezing. EXAM: CHEST  2 VIEW COMPARISON:  Chest radiograph 09/22/2013 FINDINGS: Stable cardiac and mediastinal contours. No consolidative pulmonary opacities. No pleural effusion or pneumothorax. Thoracic spine degenerative changes. Pulmonary hyperinflation. IMPRESSION: Pulmonary hyperinflation.  No acute cardiopulmonary process. Cardiomegaly. Electronically Signed   By: Annia Beltrew  Davis M.D.   On: 07/05/2016 14:24     ASSESSMENT AND PLAN:   Jeremy Wells  is a [age] y.o. male with a known history of Chronic respiratory failure secondary to COPD on 4 L home oxygen, diabetes mellitus, chronic pain on morphine, arthritis, ongoing smoking presents to hospital secondary to worsening shortness of breath.  #1 acute on chronic COPD exacerbation-remains on 4 L oxygen at this time. -Still having significant wheezing and bronchospasm. We'll  increase IV steroids, add Pulmicort nebs. Continue DuoNeb's. -Continue empiric Levaquin, Singulair. Slow to improve. Patient on chronic oxygen at home. - will add some Roxanol PRN for air hunger  #2 diabetes mellitus-check A1c. Hold Glipizide and cont. metformin at home.  - cont. On sliding scale insulin here.  #3 chronic low back pain-follows with pain management clinic.  - cont. MS contin  #4 GERD-on Protonix.  #5 tobacco abuse - cont. Nicotine patch.    All the records are reviewed and case discussed with Care Management/Social Worker. Management plans discussed with the patient, family and they are in agreement.  CODE STATUS: Full code  DVT Prophylaxis: Lovenox  TOTAL TIME TAKING CARE OF THIS PATIENT: 30 minutes.   POSSIBLE D/C IN 1-2 DAYS, DEPENDING ON CLINICAL CONDITION.   Houston Siren M.D on 07/06/2016 at 2:15 PM  Between 7am to 6pm - Pager - [phone]  After 6pm go to [email or web address] - Social research officer, government  [facility] [place] [facility]  Office  [phone]  CC: Primary care physician; Patient, No Pcp Per

## 2016-07-07 LAB — HEMOGLOBIN A1C
HEMOGLOBIN A1C: 8.7 % — AB (ref 4.8–5.6)
MEAN PLASMA GLUCOSE: 203 mg/dL

## 2016-07-07 LAB — GLUCOSE, CAPILLARY
GLUCOSE-CAPILLARY: 259 mg/dL — AB (ref 65–99)
Glucose-Capillary: 302 mg/dL — ABNORMAL HIGH (ref 65–99)

## 2016-07-07 MED ORDER — LEVOFLOXACIN 500 MG PO TABS: 500.0000 mg | ORAL_TABLET | Freq: Every day | ORAL | 0 refills | Status: AC

## 2016-07-07 MED ORDER — PREDNISONE 10 MG PO TABS: ORAL_TABLET | ORAL | 0 refills | Status: DC

## 2016-07-07 MED ORDER — NICOTINE 14 MG/24HR TD PT24: 14.0000 mg | MEDICATED_PATCH | Freq: Every day | TRANSDERMAL | 0 refills | Status: AC

## 2016-07-07 NOTE — Progress Notes (Signed)
Pt is d/ced home.  Will go home with prednisone taper, nicotine patch, and Levaquin for 5 days.  Patient has expressed desire to stop smoking.  Printed and reviewed this information with patient and family, and also [facility].  IVs removed by nurse tech.  Explained that his blood sugars will be elevated on steroids and to be diligent about eating low carb/heart healthy diet. Patient will go home with wife and granddaughter.  There are no f/u appts.

## 2016-07-07 NOTE — Discharge Summary (Signed)
[employer] [facility] - [place] at [facility]   PATIENT NAME: Jeremy Wells    MR#:  [id number]  DATE OF BIRTH:  Nov 28, 1940  DATE OF ADMISSION:  07/05/2016 ADMITTING PHYSICIAN: Jeremy Baas, MD  DATE OF DISCHARGE: 07/07/2016 12:00 PM  PRIMARY CARE PHYSICIAN: Patient, No Pcp Per    ADMISSION DIAGNOSIS:  COPD exacerbation (HCC) [J44.1]  DISCHARGE DIAGNOSIS:  Active Problems:   COPD exacerbation (HCC)   SECONDARY DIAGNOSIS:   Past Medical History:  Diagnosis Date  . Chronic pain   . COPD (chronic obstructive pulmonary disease) (HCC)    on 4L home oxygen  . Diabetes mellitus without complication (HCC)   . Emphysema/COPD [facility])     HOSPITAL COURSE:   Jeremy Wells a 76 y.o. malewith a known history of Chronic respiratory failure secondary to COPD on 4 L home oxygen, diabetes mellitus, chronic pain on morphine, arthritis, ongoing smoking presents to hospital secondary to worsening shortness of breath.  #1 acute on chronic COPD exacerbation-Patient was treated with IV steroids, Pulmicort nebs, scheduled DuoNeb's and also empiric Levaquin. -After 2 days of aggressive therapy patient's shortness of breath has improved. He is less wheezing and bronchospasm. He has been weaned down to his 4 L nasal cannula oxygen. He feels better and therefore being discharged home. He will be discharged on a oral prednisone taper along with Levaquin. -We will continue his Spiriva and albuterol rescue inhaler.  #2 diabetes mellitus-while in the hospital patient was maintained on sliding scale insulin, but he will resume his glipizide and metformin upon discharge.  #3 chronic low back pain-follows with pain management clinic.  - he will cont. his MS contin  #4 GERD- he will cont. Omeprazole.  #5 tobacco abuse - he was discharged on Nicotine patch.   DISCHARGE CONDITIONS:   Stable  CONSULTS OBTAINED:    DRUG ALLERGIES:  No Known Allergies  DISCHARGE MEDICATIONS:    Allergies as of 07/07/2016   No Known Allergies     Medication List    TAKE these medications   ADVAIR DISKUS 250-50 MCG/DOSE Aepb Generic drug:  Fluticasone-Salmeterol Inhale 1 puff into the lungs 2 (two) times daily.   albuterol 108 (90 Base) MCG/ACT inhaler Commonly known as:  PROVENTIL HFA;VENTOLIN HFA Inhale 1-2 puffs into the lungs every 4 (four) hours as needed for wheezing or shortness of breath.   albuterol (2.5 MG/3ML) 0.083% nebulizer solution Commonly known as:  PROVENTIL Take 2.5 mg by nebulization every 4 (four) hours as needed for wheezing or shortness of breath.   aspirin EC 81 MG tablet Take 81 mg by mouth daily.   glipiZIDE 10 MG tablet Commonly known as:  GLUCOTROL Take 10 mg by mouth 2 (two) times daily before a meal.   levofloxacin 500 MG tablet Commonly known as:  LEVAQUIN Take 1 tablet (500 mg total) by mouth daily.   metFORMIN 1000 MG tablet Commonly known as:  GLUCOPHAGE Take 1,000 mg by mouth 2 (two) times daily with a meal.   montelukast 10 MG tablet Commonly known as:  SINGULAIR Take 10 mg by mouth at bedtime.   morphine 30 MG tablet Commonly known as:  MSIR Take 30 mg by mouth 3 (three) times daily.   nicotine 14 mg/24hr patch Commonly known as:  NICODERM CQ - dosed in mg/24 hours Place 1 patch (14 mg total) onto the skin daily. Start taking on:  07/08/2016   omeprazole 20 MG capsule Commonly known as:  PRILOSEC Take 20 mg by mouth daily.  predniSONE 10 MG tablet Commonly known as:  DELTASONE Take 1 mg by mouth daily with breakfast. What changed:  Another medication with the same name was added. Make sure you understand how and when to take each.   predniSONE 10 MG tablet Commonly known as:  DELTASONE Label  & dispense according to the schedule below. 5 Pills PO for 2 days then, 4 Pills PO for 2 days, 3 Pills PO for 2 days, 2 Pills PO for 2 days, 1 Pill PO for 2 days then STOP. What changed:  You were already taking a medication  with the same name, and this prescription was added. Make sure you understand how and when to take each.   tiotropium 18 MCG inhalation capsule Commonly known as:  SPIRIVA Place 18 mcg into inhaler and inhale as needed.         DISCHARGE INSTRUCTIONS:   DIET:  Cardiac diet and Diabetic diet  DISCHARGE CONDITION:  Stable  ACTIVITY:  Activity as tolerated  OXYGEN:  Home Oxygen: Yes.     Oxygen Delivery: 4 liters/min via Patient connected to nasal cannula oxygen  DISCHARGE LOCATION:  home   If you experience worsening of your admission symptoms, develop shortness of breath, life threatening emergency, suicidal or homicidal thoughts you must seek medical attention immediately by calling 911 or calling your MD immediately  if symptoms less severe.  You Must read complete instructions/literature along with all the possible adverse reactions/side effects for all the Medicines you take and that have been prescribed to you. Take any new Medicines after you have completely understood and accpet all the possible adverse reactions/side effects.   Please note  You were cared for by a hospitalist during your hospital stay. If you have any questions about your discharge medications or the care you received while you were in the hospital after you are discharged, you can call the unit and asked to speak with the hospitalist on call if the hospitalist that took care of you is not available. Once you are discharged, your primary care physician will handle any further medical issues. Please note that NO REFILLS for any discharge medications will be authorized once you are discharged, as it is imperative that you return to your primary care physician (or establish a relationship with a primary care physician if you do not have one) for your aftercare needs so that they can reassess your need for medications and monitor your lab values.     Today   Shortness of breath improved.  No other acute  events overnight.   VITAL SIGNS:  Blood pressure (!) 169/65, pulse 84, temperature 97.7 F (36.5 C), resp. rate 20, height 5\' 10"  (1.778 m), weight 93.4 kg (206 lb), SpO2 97 %.  I/O:   Intake/Output Summary (Last 24 hours) at 07/07/16 1459 Last data filed at 07/06/16 1800  Gross per 24 hour  Intake              240 ml  Output                0 ml  Net              240 ml    PHYSICAL EXAMINATION:  GENERAL:  76 y.o.-year-old patient lying in the bed with no acute distress.  EYES: Pupils equal, round, reactive to light and accommodation. No scleral icterus. Extraocular muscles intact.  HEENT: Head atraumatic, normocephalic. Oropharynx and nasopharynx clear.  NECK:  Supple, no jugular venous distention. No  thyroid enlargement, no tenderness.  LUNGS: Normal breath sounds bilaterally, no wheezing, rales,rhonchi. No use of accessory muscles of respiration.  CARDIOVASCULAR: S1, S2 normal. No murmurs, rubs, or gallops.  ABDOMEN: Soft, non-tender, non-distended. Bowel sounds present. No organomegaly or mass.  EXTREMITIES: No pedal edema, cyanosis, or clubbing.  NEUROLOGIC: Cranial nerves II through XII are intact. No focal motor or sensory defecits b/l.  PSYCHIATRIC: The patient is alert and oriented x 3. Good affect.  SKIN: No obvious rash, lesion, or ulcer.   DATA REVIEW:   CBC  Recent Labs Lab 07/06/16 0456  WBC 8.5  HGB 12.0*  HCT 36.9*  PLT 130*    Chemistries   Recent Labs Lab 07/05/16 1401 07/06/16 0456  NA 135 138  K 4.7 5.1  CL 95* 96*  CO2 32 33*  GLUCOSE 334* 155*  BUN 16 18  CREATININE 1.14 1.22  CALCIUM 8.7* 8.8*  AST 22  --   ALT 20  --   ALKPHOS 71  --   BILITOT 0.5  --     Cardiac Enzymes  Recent Labs Lab 07/05/16 1401  TROPONINI <0.03    Microbiology Results  No results found for this or any previous visit.  RADIOLOGY:  No results found.    Management plans discussed with the patient, family and they are in agreement.  CODE STATUS:      Code Status Orders        Start     Ordered   07/05/16 1820  Full code  Continuous     07/05/16 1819    Code Status History    Date Active Date Inactive Code Status Order ID Comments User Context   This patient has a current code status but no historical code status.    Advance Directive Documentation     Most Recent Value  Type of Advance Directive  Healthcare Power of Attorney, Living will  Pre-existing out of facility DNR order (yellow form or pink MOST form)  -  "MOST" Form in Place?  -      TOTAL TIME TAKING CARE OF THIS PATIENT: 40 minutes.    Houston SirenSAINANI,VIVEK J M.D on 07/07/2016 at 2:59 PM  Between 7am to 6pm - Pager - [phone]  After 6pm go to [email or web address] - Social research officer, governmentpassword [email or web address]  [facility] [place] [facility]  Office  [phone]  CC: Primary care physician; Patient, No Pcp Per

## 2016-07-07 NOTE — Progress Notes (Signed)
Inpatient Diabetes Program Recommendations  AACE/ADA: New Consensus Statement on Inpatient Glycemic Control (2015)  Target Ranges:  Prepandial:   less than 140 mg/dL      Peak postprandial:   less than 180 mg/dL (1-2 hours)      Critically ill patients:  140 - 180 mg/dL   Results for [place] PlanasCALDWELL, Win N SR. (MRN [id number]) as of 07/07/2016 08:19  Ref. Range 07/06/2016 08:16 07/06/2016 12:02 07/06/2016 16:32 07/06/2016 20:09  Glucose-Capillary Latest Ref Range: 65 - 99 mg/dL 086116 (H) 578160 (H) 469270 (H) 228 (H)   Results for [place] PlanasCALDWELL, Deloyd N SR. (MRN [id number]) as of 07/07/2016 08:19  Ref. Range 07/07/2016 07:35  Glucose-Capillary Latest Ref Range: 65 - 99 mg/dL 244302 (H)    Current Insulin Orders: Metformin 1000 mg BID      Novolog Sensitive Correction Scale/ SSI (0-9 units) TID AC + HS     MD- Note patient started receiving Solumedrol 60 mg Q6 hours yesterday afternoon.  AM CBG today elevated to 302 mg/dl.  If you anticipate pt will need IV steroids for several days, please consider the following for in-patient glucose control:  1. Start Lantus 14 units daily (0.15 units/kg dosing based on weight of 93 kg)  2. Start Novolog Meal Coverage: Novolog 4 units TID with meals (hold if pt eats <50% of meal)      --Will follow patient during hospitalization--  Ambrose FinlandJeannine Johnston Brittani Purdum RN, MSN, CDE Diabetes Coordinator Inpatient Glycemic Control Team Team Pager: [phone] (8a-5p)

## 2016-10-22 ENCOUNTER — Emergency Department: Payer: Medicare Other

## 2016-10-22 ENCOUNTER — Inpatient Hospital Stay
Admission: EM | Admit: 2016-10-22 | Discharge: 2016-10-26 | DRG: 190 | Disposition: A | Discharge status: Home / Self Care | Payer: Medicare Other | Attending: Internal Medicine | Admitting: Internal Medicine

## 2016-10-22 DIAGNOSIS — E1165 Type 2 diabetes mellitus with hyperglycemia: Secondary | ICD-10-CM | POA: Diagnosis present

## 2016-10-22 DIAGNOSIS — J441 Chronic obstructive pulmonary disease with (acute) exacerbation: Secondary | ICD-10-CM

## 2016-10-22 DIAGNOSIS — Z96659 Presence of unspecified artificial knee joint: Secondary | ICD-10-CM | POA: Diagnosis present

## 2016-10-22 DIAGNOSIS — I4891 Unspecified atrial fibrillation: Secondary | ICD-10-CM | POA: Diagnosis present

## 2016-10-22 DIAGNOSIS — Z79891 Long term (current) use of opiate analgesic: Secondary | ICD-10-CM

## 2016-10-22 DIAGNOSIS — Z885 Allergy status to narcotic agent status: Secondary | ICD-10-CM

## 2016-10-22 DIAGNOSIS — Z9981 Dependence on supplemental oxygen: Secondary | ICD-10-CM

## 2016-10-22 DIAGNOSIS — Z823 Family history of stroke: Secondary | ICD-10-CM | POA: Diagnosis not present

## 2016-10-22 DIAGNOSIS — J9601 Acute respiratory failure with hypoxia: Secondary | ICD-10-CM | POA: Diagnosis not present

## 2016-10-22 DIAGNOSIS — Z7952 Long term (current) use of systemic steroids: Secondary | ICD-10-CM

## 2016-10-22 DIAGNOSIS — Z888 Allergy status to other drugs, medicaments and biological substances status: Secondary | ICD-10-CM

## 2016-10-22 DIAGNOSIS — F17210 Nicotine dependence, cigarettes, uncomplicated: Secondary | ICD-10-CM | POA: Diagnosis present

## 2016-10-22 DIAGNOSIS — D649 Anemia, unspecified: Secondary | ICD-10-CM | POA: Diagnosis present

## 2016-10-22 DIAGNOSIS — J9621 Acute and chronic respiratory failure with hypoxia: Secondary | ICD-10-CM | POA: Diagnosis present

## 2016-10-22 DIAGNOSIS — G894 Chronic pain syndrome: Secondary | ICD-10-CM | POA: Diagnosis present

## 2016-10-22 DIAGNOSIS — T380X5A Adverse effect of glucocorticoids and synthetic analogues, initial encounter: Secondary | ICD-10-CM | POA: Diagnosis present

## 2016-10-22 DIAGNOSIS — Z7984 Long term (current) use of oral hypoglycemic drugs: Secondary | ICD-10-CM | POA: Diagnosis not present

## 2016-10-22 DIAGNOSIS — N179 Acute kidney failure, unspecified: Secondary | ICD-10-CM | POA: Diagnosis present

## 2016-10-22 DIAGNOSIS — J9600 Acute respiratory failure, unspecified whether with hypoxia or hypercapnia: Secondary | ICD-10-CM | POA: Diagnosis present

## 2016-10-22 DIAGNOSIS — R0602 Shortness of breath: Secondary | ICD-10-CM | POA: Diagnosis present

## 2016-10-22 DIAGNOSIS — J439 Emphysema, unspecified: Secondary | ICD-10-CM | POA: Diagnosis present

## 2016-10-22 DIAGNOSIS — Z7951 Long term (current) use of inhaled steroids: Secondary | ICD-10-CM | POA: Diagnosis not present

## 2016-10-22 DIAGNOSIS — Z7982 Long term (current) use of aspirin: Secondary | ICD-10-CM

## 2016-10-22 LAB — BLOOD GAS, VENOUS
ACID-BASE EXCESS: 5.8 mmol/L — AB (ref 0.0–2.0)
Bicarbonate: 32.4 mmol/L — ABNORMAL HIGH (ref 20.0–28.0)
FIO2: 0.28
O2 SAT: 92.4 %
PATIENT TEMPERATURE: 37
PO2 VEN: 67 mmHg — AB (ref 32.0–45.0)
pCO2, Ven: 56 mmHg (ref 44.0–60.0)
pH, Ven: 7.37 (ref 7.250–7.430)

## 2016-10-22 LAB — CBC
HEMATOCRIT: 32.8 % — AB (ref 40.0–52.0)
HEMOGLOBIN: 10.9 g/dL — AB (ref 13.0–18.0)
MCH: 29.3 pg (ref 26.0–34.0)
MCHC: 33.3 g/dL (ref 32.0–36.0)
MCV: 88.1 fL (ref 80.0–100.0)
Platelets: 153 10*3/uL (ref 150–440)
RBC: 3.73 MIL/uL — ABNORMAL LOW (ref 4.40–5.90)
RDW: 14.5 % (ref 11.5–14.5)
WBC: 8 10*3/uL (ref 3.8–10.6)

## 2016-10-22 LAB — TROPONIN I

## 2016-10-22 LAB — COMPREHENSIVE METABOLIC PANEL
ALK PHOS: 64 U/L (ref 38–126)
ALT: 17 U/L (ref 17–63)
ANION GAP: 12 (ref 5–15)
AST: 28 U/L (ref 15–41)
Albumin: 3.7 g/dL (ref 3.5–5.0)
BILIRUBIN TOTAL: 0.6 mg/dL (ref 0.3–1.2)
BUN: 13 mg/dL (ref 6–20)
CALCIUM: 8.3 mg/dL — AB (ref 8.9–10.3)
CO2: 28 mmol/L (ref 22–32)
Chloride: 99 mmol/L — ABNORMAL LOW (ref 101–111)
Creatinine, Ser: 1.27 mg/dL — ABNORMAL HIGH (ref 0.61–1.24)
GFR, EST NON AFRICAN AMERICAN: 54 mL/min — AB (ref >60)
GLUCOSE: 207 mg/dL — AB (ref 65–99)
Potassium: 4.3 mmol/L (ref 3.5–5.1)
Sodium: 139 mmol/L (ref 135–145)
TOTAL PROTEIN: 7.3 g/dL (ref 6.5–8.1)

## 2016-10-22 LAB — GLUCOSE, CAPILLARY
GLUCOSE-CAPILLARY: 172 mg/dL — AB (ref 65–99)
Glucose-Capillary: 213 mg/dL — ABNORMAL HIGH (ref 65–99)

## 2016-10-22 LAB — MRSA PCR SCREENING: MRSA BY PCR: NEGATIVE

## 2016-10-22 MED ORDER — INFLUENZA VAC SPLIT HIGH-DOSE 0.5 ML IM SUSY
0.5000 mL | PREFILLED_SYRINGE | INTRAMUSCULAR | Status: DC
  Filled 2016-10-22 (×2): qty 0.5

## 2016-10-22 MED ORDER — IPRATROPIUM-ALBUTEROL 0.5-2.5 (3) MG/3ML IN SOLN
RESPIRATORY_TRACT | Status: AC
  Filled 2016-10-22: qty 3

## 2016-10-22 MED ORDER — BUDESONIDE 0.25 MG/2ML IN SUSP
0.2500 mg | Freq: Two times a day (BID) | RESPIRATORY_TRACT | Status: DC
  Administered 2016-10-22 – 2016-10-26 (×8): 0.25 mg via RESPIRATORY_TRACT
  Filled 2016-10-22 (×8): qty 2

## 2016-10-22 MED ORDER — ALBUTEROL SULFATE (2.5 MG/3ML) 0.083% IN NEBU
2.5000 mg | INHALATION_SOLUTION | RESPIRATORY_TRACT | Status: DC | PRN
  Administered 2016-10-24: 2.5 mg via RESPIRATORY_TRACT
  Filled 2016-10-22: qty 3

## 2016-10-22 MED ORDER — ENOXAPARIN SODIUM 40 MG/0.4ML ~~LOC~~ SOLN
40.0000 mg | SUBCUTANEOUS | Status: DC
  Administered 2016-10-22: 40 mg via SUBCUTANEOUS
  Filled 2016-10-22: qty 0.4

## 2016-10-22 MED ORDER — ORAL CARE MOUTH RINSE
15.0000 mL | Freq: Two times a day (BID) | OROMUCOSAL | Status: DC
  Administered 2016-10-24: 15 mL via OROMUCOSAL

## 2016-10-22 MED ORDER — INSULIN ASPART 100 UNIT/ML ~~LOC~~ SOLN
0.0000 [IU] | Freq: Three times a day (TID) | SUBCUTANEOUS | Status: DC
  Administered 2016-10-22: 5 [IU] via SUBCUTANEOUS
  Administered 2016-10-23: 3 [IU] via SUBCUTANEOUS
  Administered 2016-10-23: 8 [IU] via SUBCUTANEOUS
  Administered 2016-10-23 – 2016-10-24 (×4): 5 [IU] via SUBCUTANEOUS
  Administered 2016-10-25: 2 [IU] via SUBCUTANEOUS
  Administered 2016-10-25: 5 [IU] via SUBCUTANEOUS
  Administered 2016-10-25: 11 [IU] via SUBCUTANEOUS
  Administered 2016-10-26: 5 [IU] via SUBCUTANEOUS
  Filled 2016-10-22 (×11): qty 1

## 2016-10-22 MED ORDER — PANTOPRAZOLE SODIUM 40 MG PO TBEC
40.0000 mg | DELAYED_RELEASE_TABLET | Freq: Two times a day (BID) | ORAL | Status: DC
  Administered 2016-10-23 – 2016-10-26 (×6): 40 mg via ORAL
  Filled 2016-10-22 (×7): qty 1

## 2016-10-22 MED ORDER — HALOPERIDOL LACTATE 5 MG/ML IJ SOLN
2.0000 mg | Freq: Once | INTRAMUSCULAR | Status: AC
  Administered 2016-10-22: 2 mg via INTRAVENOUS
  Filled 2016-10-22: qty 1

## 2016-10-22 MED ORDER — ACETAMINOPHEN 650 MG RE SUPP: 650.0000 mg | Freq: Four times a day (QID) | RECTAL | Status: DC | PRN

## 2016-10-22 MED ORDER — IPRATROPIUM-ALBUTEROL 0.5-2.5 (3) MG/3ML IN SOLN
3.0000 mL | Freq: Once | RESPIRATORY_TRACT | Status: AC
  Administered 2016-10-22 (×2): 3 mL via RESPIRATORY_TRACT

## 2016-10-22 MED ORDER — ONDANSETRON HCL 4 MG/2ML IJ SOLN
4.0000 mg | Freq: Four times a day (QID) | INTRAMUSCULAR | Status: DC | PRN
Start: 2016-10-22 — End: 2016-10-26

## 2016-10-22 MED ORDER — DEXTROSE 5 % IV SOLN
500.0000 mg | INTRAVENOUS | Status: DC
  Administered 2016-10-22 – 2016-10-24 (×3): 500 mg via INTRAVENOUS
  Filled 2016-10-22 (×4): qty 500

## 2016-10-22 MED ORDER — SODIUM CHLORIDE 0.9 % IV SOLN
INTRAVENOUS | Status: AC
  Administered 2016-10-22: 17:00:00 via INTRAVENOUS

## 2016-10-22 MED ORDER — MAGNESIUM SULFATE 2 GM/50ML IV SOLN
2.0000 g | Freq: Once | INTRAVENOUS | Status: AC
  Administered 2016-10-22: 2 g via INTRAVENOUS
  Filled 2016-10-22: qty 50

## 2016-10-22 MED ORDER — METHYLPREDNISOLONE SODIUM SUCC 125 MG IJ SOLR
80.0000 mg | Freq: Three times a day (TID) | INTRAMUSCULAR | Status: DC
  Administered 2016-10-22 – 2016-10-23 (×2): 80 mg via INTRAVENOUS
  Filled 2016-10-22 (×2): qty 2

## 2016-10-22 MED ORDER — ONDANSETRON HCL 4 MG PO TABS: 4.0000 mg | ORAL_TABLET | Freq: Four times a day (QID) | ORAL | Status: DC | PRN

## 2016-10-22 MED ORDER — IPRATROPIUM-ALBUTEROL 0.5-2.5 (3) MG/3ML IN SOLN: 3.0000 mL | Freq: Four times a day (QID) | RESPIRATORY_TRACT | Status: DC

## 2016-10-22 MED ORDER — METHYLPREDNISOLONE SODIUM SUCC 125 MG IJ SOLR
60.0000 mg | Freq: Four times a day (QID) | INTRAMUSCULAR | Status: DC
  Administered 2016-10-22: 60 mg via INTRAVENOUS
  Filled 2016-10-22: qty 2

## 2016-10-22 MED ORDER — METHYLPREDNISOLONE SODIUM SUCC 125 MG IJ SOLR
125.0000 mg | Freq: Once | INTRAMUSCULAR | Status: AC
  Administered 2016-10-22: 125 mg via INTRAVENOUS
  Filled 2016-10-22: qty 2

## 2016-10-22 MED ORDER — ALBUTEROL SULFATE (2.5 MG/3ML) 0.083% IN NEBU
5.0000 mg | INHALATION_SOLUTION | Freq: Once | RESPIRATORY_TRACT | Status: AC
  Administered 2016-10-22: 5 mg via RESPIRATORY_TRACT
  Filled 2016-10-22: qty 6

## 2016-10-22 MED ORDER — INSULIN ASPART 100 UNIT/ML ~~LOC~~ SOLN
0.0000 [IU] | Freq: Every day | SUBCUTANEOUS | Status: DC
  Administered 2016-10-23: 5 [IU] via SUBCUTANEOUS
  Administered 2016-10-25: 2 [IU] via SUBCUTANEOUS
  Filled 2016-10-22 (×2): qty 1

## 2016-10-22 MED ORDER — CHLORHEXIDINE GLUCONATE 0.12 % MT SOLN
15.0000 mL | Freq: Two times a day (BID) | OROMUCOSAL | Status: DC
  Administered 2016-10-22 – 2016-10-26 (×8): 15 mL via OROMUCOSAL
  Filled 2016-10-22 (×8): qty 15

## 2016-10-22 MED ORDER — IPRATROPIUM-ALBUTEROL 0.5-2.5 (3) MG/3ML IN SOLN
3.0000 mL | RESPIRATORY_TRACT | Status: DC
  Administered 2016-10-22 – 2016-10-24 (×10): 3 mL via RESPIRATORY_TRACT
  Filled 2016-10-22 (×11): qty 3

## 2016-10-22 MED ORDER — ACETAMINOPHEN 325 MG PO TABS: 650.0000 mg | ORAL_TABLET | Freq: Four times a day (QID) | ORAL | Status: DC | PRN

## 2016-10-22 MED ORDER — IPRATROPIUM-ALBUTEROL 0.5-2.5 (3) MG/3ML IN SOLN
RESPIRATORY_TRACT | Status: AC
  Administered 2016-10-22: 3 mL via RESPIRATORY_TRACT
  Filled 2016-10-22: qty 3

## 2016-10-22 NOTE — ED Notes (Signed)
Jeremy Harman, NP at bedside at this time.

## 2016-10-22 NOTE — Consult Note (Signed)
Name: Jeremy HAMME Sr. MRN: [id number] DOB: 08-03-1940    ADMISSION DATE:  10/22/2016 CONSULTATION DATE: 10/22/2016  REFERRING MD : Dr. Amado Coe  CHIEF COMPLAINT: Shortness of Breath   BRIEF PATIENT DESCRIPTION:  75 yo male admitted 09/20 with acute on chronic respiratory failure secondary to AECOPD requiring continuous Bipap   SIGNIFICANT EVENTS  09/20-Pt admitted to stepdown unit   STUDIES:  None   HISTORY OF PRESENT ILLNESS:   This is a 76 yo male with a PMH of Emphysema/COPD, Diabetes Mellitus on chronic O2 , and Chronic Pain.  He presented to [facility] ER 09/20 with c/o increased shortness of breath despite use of home O2 onset of symptoms 4-5 days prior to presentation.  In the ER it was noted the pt had labored respirations with audible wheezes, therefore he received a duoneb treatment, 125 mg iv solumedrol, and iv magnesium.  However, despite interventions his respiratory failure did not improve and he was placed on Bipap.  CXR results were negative he was subsequently admitted by hospitalist team to the stepdown unit for further workup and treatment PCCM consulted.    PAST MEDICAL HISTORY :   has a past medical history of Chronic pain; COPD (chronic obstructive pulmonary disease) (HCC); Diabetes mellitus without complication (HCC); and Emphysema/COPD (HCC).  has a past surgical history that includes Replacement total knee. Prior to Admission medications   Medication Sig Start Date End Date Taking? Authorizing Provider  albuterol (PROVENTIL HFA;VENTOLIN HFA) 108 (90 Base) MCG/ACT inhaler Inhale 1-2 puffs into the lungs every 4 (four) hours as needed for wheezing or shortness of breath.   Yes [provider]  albuterol (PROVENTIL) (2.5 MG/3ML) 0.083% nebulizer solution Take 2.5 mg by nebulization every 4 (four) hours as needed for wheezing or shortness of breath.   Yes [provider]  aspirin EC 81 MG tablet Take 81 mg by mouth daily.   Yes [provider]  Fluticasone-Salmeterol (ADVAIR DISKUS) 250-50 MCG/DOSE AEPB Inhale 1 puff into the lungs 2 (two) times daily.   Yes [provider]  glipiZIDE (GLUCOTROL) 10 MG tablet Take 10 mg by mouth 2 (two) times daily before a meal.   Yes [provider]  metFORMIN (GLUCOPHAGE) 1000 MG tablet Take 1,000 mg by mouth 2 (two) times daily with a meal.   Yes [provider]  morphine (MSIR) 30 MG tablet Take 30 mg by mouth 3 (three) times daily.   Yes [provider]  omeprazole (PRILOSEC) 20 MG capsule Take 20 mg by mouth daily.   Yes [provider]  predniSONE (DELTASONE) 10 MG tablet Label  & dispense according to the schedule below. 5 Pills PO for 2 days then, 4 Pills PO for 2 days, 3 Pills PO for 2 days, 2 Pills PO for 2 days, 1 Pill PO for 2 days then STOP. 07/07/16  Yes Sainani, Rolly Pancake, MD  montelukast (SINGULAIR) 10 MG tablet Take 10 mg by mouth at bedtime.    [provider]  nicotine (NICODERM CQ - DOSED IN MG/24 HOURS) 14 mg/24hr patch Place 1 patch (14 mg total) onto the skin daily. Patient not taking: Reported on 10/22/2016 07/08/16   Houston Siren, MD   Allergies  Allergen Reactions  . Ambien [Zolpidem] Nausea And Vomiting  . Codeine Nausea And Vomiting    FAMILY HISTORY:  family history includes CVA in his father. SOCIAL HISTORY:  reports that he has been smoking Cigarettes.  He has been smoking about 0.50  packs per day. He has never used smokeless tobacco. He reports that he does not drink alcohol or use drugs.  REVIEW OF SYSTEMS: Positives in BOLD  Constitutional: Negative for fever, chills, weight loss, malaise/fatigue and diaphoresis.  HENT: Negative for hearing loss, ear pain, nosebleeds, congestion, sore throat, neck pain, tinnitus and ear discharge.   Eyes: Negative for blurred vision, double vision, photophobia, pain, discharge and redness.  Respiratory: cough, hemoptysis, sputum production, shortness of breath,  wheezing and stridor.   Cardiovascular: Negative for chest pain, palpitations, orthopnea, claudication, leg swelling and PND.  Gastrointestinal: Negative for heartburn, nausea, vomiting, abdominal pain, diarrhea, constipation, blood in stool and melena.  Genitourinary: Negative for dysuria, urgency, frequency, hematuria and flank pain.  Musculoskeletal: Negative for myalgias, back pain, joint pain and falls.  Skin: Negative for itching and rash.  Neurological: Negative for dizziness, tingling, tremors, sensory change, speech change, focal weakness, seizures, loss of consciousness, weakness and headaches.  Endo/Heme/Allergies: Negative for environmental allergies and polydipsia. Does not bruise/bleed easily.  SUBJECTIVE:  Pt states he does not like the Bipap and wants something to drink.  VITAL SIGNS: Temp:  [97.7 F (36.5 C)] 97.7 F (36.5 C) (09/20 1434) Pulse Rate:  [33-115] 33 (09/20 1500) Resp:  [30] 30 (09/20 1500) BP: (142-176)/(62-66) 176/66 (09/20 1500) SpO2:  [97 %] 97 % (09/20 1500) Weight:  [98 kg (216 lb)] 98 kg (216 lb) (09/20 1435)  PHYSICAL EXAMINATION: General: well developed, well nourished Caucasian male, NAD on Bipap  Neuro: alert and oriented, follows commands HEENT: supple, no JVD Cardiovascular: s1s2, rrr, no M/R/G Lungs: faint expiratory wheezes throughout, even, non labored  Abdomen: +BS x4, obese, soft, non tender, non distended, small umbilical hernia  Musculoskeletal: normal bulk and tone, no edema  Skin: intact no rashes or lesions    Recent Labs Lab 10/22/16 1442  NA 139  K 4.3  CL 99*  CO2 28  BUN 13  CREATININE 1.27*  GLUCOSE 207*    Recent Labs Lab 10/22/16 1442  HGB 10.9*  HCT 32.8*  WBC 8.0  PLT 153   Dg Chest Portable 1 View  Result Date: 10/22/2016 CLINICAL DATA:  Shortness of breath. EXAM: PORTABLE CHEST 1 VIEW COMPARISON:  Radiographs of July 05, 2016. FINDINGS: The heart size and mediastinal contours are within normal  limits. Both lungs are clear. No pneumothorax or pleural effusion is noted. Atherosclerosis of thoracic aorta is noted. The visualized skeletal structures are unremarkable. IMPRESSION: No acute cardiopulmonary abnormality seen.  Aortic atherosclerosis. Electronically Signed   By: Lupita Raider, M.D.   On: 10/22/2016 15:19    ASSESSMENT / PLAN: Acute on chronic respiratory failure secondary to AECOPD Acute Renal Failure Anemia without acute blood loss  Hyperglycemia  P: Prn Bipap to maintain O2 sats 88% to 92% and/or for dyspnea  Continue scheduled and prn bronchodilator therapy Continue iv and nebulized steroids  Prn CXR and ABG's Trend WBC and monitor fever curve IV Azithromycin  Lovenox for VTE prophylaxis Monitor for s/sx of bleeding Transfuse for hgb <7 Trend BMP Replace electrolytes as indicated Monitor UOP CBG's q4hr and SSI   Sonda Rumble, AGNP  Pulmonary/Critical Care Pager [phone] (please enter 7 digits) PCCM Consult Pager [phone] (please enter 7 digits)  STAFF NOTE: I. Dr. Nicholos Johns, have personally reviewed the patient's available data including medical history , events of notes, physican examination and test results as part of my evaluation. I have discussed with the  Care with the NP and other care providers  including  pharmacist, ICU RN, RRT, dietary.  Physical Exam Lungs - Decreased air entry bilaterally.   Images personally reviewed, consistent with hyperinflation, COPD/emphysema. Review of VBG c/w hypercapnic resp failure. Continue solumedrol for AECOPD. Wean down/off bipap as tolerated. Continue empiric abx for acute bronchitis.   Wells Guiles, MD.   Board Certified in Internal Medicine, Pulmonary Medicine, Critical Care Medicine, and Sleep Medicine.  [facility] Office Number: [phone] Pager: [phone]  Santiago Glad, M.D.  Billy Fischer, M.D 10/23/2016

## 2016-10-22 NOTE — ED Provider Notes (Signed)
[facility] Emergency Department Provider Note   ____________________________________________    I have reviewed the triage vital signs and the nursing notes.   HISTORY  Chief Complaint Shortness of Breath     HPI Jeremy KRAEGER Sr. is a 76 y.o. male Who presents with shortness of breath. Patient has a history of COPD on 4 L home oxygen, he also has diabetes. He reports worsening breathing over the last 3-4 days. He does report a dry cough. No fevers or chills. No calf pain or swelling. No nausea or vomiting. No pleurisy.no chest pain. He has not taken anything for this. Exerting himself makes it worse   Past Medical History:  Diagnosis Date  . Chronic pain   . COPD (chronic obstructive pulmonary disease) (HCC)    on 4L home oxygen  . Diabetes mellitus without complication (HCC)   . Emphysema/COPD [facility])     Patient Active Problem List   Diagnosis Date Noted  . COPD exacerbation (HCC) 07/05/2016    Past Surgical History:  Procedure Laterality Date  . REPLACEMENT TOTAL KNEE      Prior to Admission medications   Medication Sig Start Date End Date Taking? Authorizing Provider  albuterol (PROVENTIL HFA;VENTOLIN HFA) 108 (90 Base) MCG/ACT inhaler Inhale 1-2 puffs into the lungs every 4 (four) hours as needed for wheezing or shortness of breath.    [provider]  albuterol (PROVENTIL) (2.5 MG/3ML) 0.083% nebulizer solution Take 2.5 mg by nebulization every 4 (four) hours as needed for wheezing or shortness of breath.    [provider]  aspirin EC 81 MG tablet Take 81 mg by mouth daily.    [provider]  Fluticasone-Salmeterol (ADVAIR DISKUS) 250-50 MCG/DOSE AEPB Inhale 1 puff into the lungs 2 (two) times daily.    [provider]  glipiZIDE (GLUCOTROL) 10 MG tablet Take 10 mg by mouth 2 (two) times daily before a meal.    [provider]  metFORMIN (GLUCOPHAGE) 1000 MG tablet Take 1,000 mg by  mouth 2 (two) times daily with a meal.    [provider]  montelukast (SINGULAIR) 10 MG tablet Take 10 mg by mouth at bedtime.    [provider]  morphine (MSIR) 30 MG tablet Take 30 mg by mouth 3 (three) times daily.    [provider]  nicotine (NICODERM CQ - DOSED IN MG/24 HOURS) 14 mg/24hr patch Place 1 patch (14 mg total) onto the skin daily. 07/08/16   Houston Siren, MD  omeprazole (PRILOSEC) 20 MG capsule Take 20 mg by mouth daily.    [provider]  predniSONE (DELTASONE) 10 MG tablet Take 1 mg by mouth daily with breakfast.    [provider]  predniSONE (DELTASONE) 10 MG tablet Label  & dispense according to the schedule below. 5 Pills PO for 2 days then, 4 Pills PO for 2 days, 3 Pills PO for 2 days, 2 Pills PO for 2 days, 1 Pill PO for 2 days then STOP. 07/07/16   Houston Siren, MD  tiotropium (SPIRIVA) 18 MCG inhalation capsule Place 18 mcg into inhaler and inhale as needed.    [provider]     Allergies Patient has no known allergies.  Family History  Problem Relation Age of Onset  . CVA Father     Social History Social History  Substance Use Topics  . Smoking status: Current Every Day Smoker    Packs/day: 0.50    Types:  Cigarettes  . Smokeless tobacco: Never Used  . Alcohol use No    Review of Systems  Constitutional: No fever/chills Eyes: No visual changes.  ENT: No sore throat. Cardiovascular: Denies chest pain. Respiratory: as above Gastrointestinal:  No nausea, no vomiting.   Genitourinary: Negative for dysuria. Musculoskeletal: Negative for back pain. Skin: Negative for rash. Neurological: Negative for headache   ____________________________________________   PHYSICAL EXAM:  VITAL SIGNS: ED Triage Vitals  Enc Vitals Group     BP 10/22/16 1434 (!) 142/62     Pulse Rate 10/22/16 1434 (!) 115     Resp 10/22/16 1434 (!) 30     Temp 10/22/16 1434 97.7 F (36.5 C)     Temp Source 10/22/16  1434 Oral     SpO2 10/22/16 1434 97 %     Weight 10/22/16 1435 98 kg (216 lb)     Height 10/22/16 1435 1.778 m ( )     Head Circumference --      Peak Flow --      Pain Score 10/22/16 1434 0     Pain Loc --      Pain Edu? --      Excl. in GC? --     Constitutional: Alert and oriented Eyes: Conjunctivae are normal.   Nose: No congestion/rhinnorhea. Mouth/Throat: Mucous membranes are moist.    Cardiovascular: tachycardia, regular rhythm. Grossly normal heart sounds.  Good peripheral circulation. Respiratory: increased respiratory effort without tachypnea, poor airflow, scattered wheezes Gastrointestinal: Soft and nontender. No distention.  No CVA tenderness. Genitourinary: deferred Musculoskeletal:   Warm and well perfused Neurologic:  Normal speech and language. No gross focal neurologic deficits are appreciated.  Skin:  Skin is warm, dry and intact. No rash noted. Psychiatric: Mood and affect are normal. Speech and behavior are normal.  ____________________________________________   LABS (all labs ordered are listed, but only abnormal results are displayed)  Labs Reviewed  CBC - Abnormal; Notable for the following:       Result Value   RBC 3.73 (*)    Hemoglobin 10.9 (*)    HCT 32.8 (*)    All other components within normal limits  BLOOD GAS, VENOUS - Abnormal; Notable for the following:    pO2, Ven 67.0 (*)    Bicarbonate 32.4 (*)    Acid-Base Excess 5.8 (*)    All other components within normal limits  COMPREHENSIVE METABOLIC PANEL  TROPONIN I   ____________________________________________  EKG  ED ECG REPORT I, Jene Every, the attending physician, personally viewed and interpreted this ECG.  Date: 10/22/2016 EKG Time: 2:43 PM Rate: 114 Rhythm: sinus tachycardia QRS Axis: normal Intervals: normal ST/T Wave abnormalities: normal  ____________________________________________  RADIOLOGY  Chest x-ray  pending ____________________________________________   PROCEDURES  Procedure(s) performed: No    Critical Care performed: No ____________________________________________   INITIAL IMPRESSION / ASSESSMENT AND PLAN / ED COURSE  Pertinent labs & imaging results that were available during my care of the patient were reviewed by me and considered in my medical decision making (see chart for details).  Patient presents with shortness of breath. Differential diagnosis includes COPD exacerbation, pneumonia, ACS, less likely PE  Review of medical records demonstrates patient has a history of severe COPD with admission on June 3 for COPD exacerbation  We will treat with IV Solu-Medrol, DuoNeb, IV magnesium him a check chest x-ray and reevaluate  I have asked Dr. Pershing Proud to re-evaluate. Anticipate likely admission unless significant improvement    ____________________________________________  FINAL CLINICAL IMPRESSION(S) / ED DIAGNOSES  Final diagnoses:  COPD exacerbation (HCC)      NEW MEDICATIONS STARTED DURING THIS VISIT:  New Prescriptions   No medications on file     Note:  This document was prepared using Dragon voice recognition software and may include unintentional dictation errors.    Jene Every, MD 10/22/16 [phone]

## 2016-10-22 NOTE — ED Notes (Signed)
Respiratory notified to bring BiPap to room. Dr. Pershing Proud at bedside due to patient complaints of his breathing not improving. Pt currently doing a duoneb at this time.

## 2016-10-22 NOTE — ED Notes (Signed)
Respiratory at bedside at this time to place BiPap on patient.

## 2016-10-22 NOTE — Progress Notes (Signed)
Resting comfortably on bipap and tolerating since haldol was given.  o2 sats 99% on bipap. NSR per cardiac monitor.

## 2016-10-22 NOTE — ED Notes (Signed)
This RN to bedside, pt is noted to be sitting up on end of bed, pt is noted to have adjusted his Bipap mask to be in wrong position. This RN readjusted mask for patient comfort.

## 2016-10-22 NOTE — Progress Notes (Signed)
Order for enoxaparin 30 mg subcutaneously changed to 40 mg daily dose per protocol for CrCl > 30 mL/min.  Cindi Carbon, PharmD 10/22/16 5:21 PM

## 2016-10-22 NOTE — ED Provider Notes (Signed)
signout from Dr. Cyril Loosen in this 76 year old male on 4 L of home oxygen who was cutting with shortness of breath. Patient has already had 3 nebulizer treatments. The plan is to reevaluate to see the patient will require admission to the hospital versus discharge to home.  Physical Exam  BP (!) 176/66   Pulse (!) 33   Temp 97.7 F (36.5 C) (Oral)   Resp (!) 30   Ht  (1.778 m)   Wt 98 kg (216 lb)   SpO2 97%   BMI 30.99 kg/m   Physical Exam patient was reexamined and he says that he feels no change or improvement despite nebs, steroids and magnesium. He is still using accessory muscles to breathe although he is able to speak in full sentences. He is moving nearly no air on expiration and has very faint wheezes throughout. ED Course  Procedures  MDM patient will be started on BiPAP. Patient will be admitted to the hospital. Patient as well as family understand the plan and are willing to comply. Signed out to Dr. Amado Coe.   CRITICAL CARE Performed by: Arelia Longest   Total critical care time: 35 minutes  Critical care time was exclusive of separately billable procedures and treating other patients.  Critical care was necessary to treat or prevent imminent or life-threatening deterioration.  Critical care was time spent personally by me on the following activities: development of treatment plan with patient and/or surrogate as well as nursing, discussions with consultants, evaluation of patient's response to treatment, examination of patient, obtaining history from patient or surrogate, ordering and performing treatments and interventions, ordering and review of laboratory studies, ordering and review of radiographic studies, pulse oximetry and re-evaluation of patient's condition.        Myrna Blazer, MD 10/22/16 [phone]

## 2016-10-22 NOTE — Progress Notes (Signed)
eLink Physician-Brief Progress Note Patient Name: Jeremy Wells. DOB: 1940/08/20 MRN: [id number]   Date of Service  10/22/2016  HPI/Events of Note  Pt with severe copd with aecopd on bipap admitted from er, looks comfortable lying down flat rotated on L with ok sats   eICU Interventions  Add ppi bid while on high dose steroids Monitor for possible intubation      Intervention Category Major Interventions: Respiratory failure - evaluation and management  Sandrea Hughs 10/22/2016, 5:42 PM

## 2016-10-22 NOTE — Progress Notes (Signed)
Upon shift assessment pt on BiPAP, Bipap removed. Pt notes say 4LNC chronically at home. Pt reports to writing nurse wears 5L chronically at home. Pt removed off BiPaP  Placed on 5LNC. Sats remains in 90%s. NAD. Will continue to monitor.

## 2016-10-22 NOTE — H&P (Signed)
[facility] [place] [facility]   PATIENT NAME: Jeremy Wells    MR#:  [id number]  DATE OF BIRTH:  [birth year]  DATE OF ADMISSION:  10/22/2016  PRIMARY CARE PHYSICIAN: Danella Penton, MD   REQUESTING/REFERRING PHYSICIAN:  Myrna Blazer, MD Physician     CHIEF COMPLAINT:   sob HISTORY OF PRESENT ILLNESS:  Jeremy Wells  is a 76 y.o. 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 Patient was severely short of breath even after 3 nebulizer treatments, Solu-Medrol and magnesium. Patient is placed on BiPAP and hospitalist team is called to admit the patient.  PAST MEDICAL HISTORY:   Past Medical History:  Diagnosis Date  . Chronic pain   . COPD (chronic obstructive pulmonary disease) (HCC)    on 4L home oxygen  . Diabetes mellitus without complication (HCC)   . Emphysema/COPD (HCC)     PAST SURGICAL HISTOIRY:   Past Surgical History:  Procedure Laterality Date  . REPLACEMENT TOTAL KNEE      SOCIAL HISTORY:   Social History  Substance Use Topics  . Smoking status: Current Every Day Smoker    Packs/day: 0.50    Types: Cigarettes  . Smokeless tobacco: Never Used  . Alcohol use No    FAMILY HISTORY:   Family History  Problem Relation Age of Onset  . CVA Father     DRUG ALLERGIES:   Allergies  Allergen Reactions  . Ambien [Zolpidem] Nausea And Vomiting  . Codeine Nausea And Vomiting    REVIEW OF SYSTEMS:  Review of systems Limited as the patient is on BiPAP. Family members are at bedside providing history    RESPIRATORY:Reporting intermittent episodes of cough and worsening shortness of breath with wheezing-per family CARDIOVASCULAR: No chest pain, orthopnea, edema.  GASTROINTESTINAL: No nausea, vomiting, diarrhea or abdominal pain.    MEDICATIONS AT  HOME:   Prior to Admission medications   Medication Sig Start Date End Date Taking? Authorizing Provider  albuterol (PROVENTIL HFA;VENTOLIN HFA) 108 (90 Base) MCG/ACT inhaler Inhale 1-2 puffs into the lungs every 4 (four) hours as needed for wheezing or shortness of breath.   Yes [provider]  albuterol (PROVENTIL) (2.5 MG/3ML) 0.083% nebulizer solution Take 2.5 mg by nebulization every 4 (four) hours as needed for wheezing or shortness of breath.   Yes [provider]  aspirin EC 81 MG tablet Take 81 mg by mouth daily.   Yes [provider]  Fluticasone-Salmeterol (ADVAIR DISKUS) 250-50 MCG/DOSE AEPB Inhale 1 puff into the lungs 2 (two) times daily.   Yes [provider]  glipiZIDE (GLUCOTROL) 10 MG tablet Take 10 mg by mouth 2 (two) times daily before a meal.   Yes [provider]  metFORMIN (GLUCOPHAGE) 1000 MG tablet Take 1,000 mg by mouth 2 (two) times daily with a meal.   Yes [provider]  morphine (MSIR) 30 MG tablet Take 30 mg by mouth 3 (three) times daily.   Yes [provider]  omeprazole (PRILOSEC) 20 MG capsule Take 20 mg by mouth daily.   Yes [provider]  predniSONE (DELTASONE) 10 MG tablet Label  & dispense according to the schedule below. 5 Pills PO for 2 days then, 4 Pills PO for 2 days, 3 Pills PO for 2 days, 2 Pills PO for  2 days, 1 Pill PO for 2 days then STOP. 07/07/16  Yes Sainani, Rolly Pancake, MD  montelukast (SINGULAIR) 10 MG tablet Take 10 mg by mouth at bedtime.    [provider]  nicotine (NICODERM CQ - DOSED IN MG/24 HOURS) 14 mg/24hr patch Place 1 patch (14 mg total) onto the skin daily. Patient not taking: Reported on 10/22/2016 07/08/16   Houston Siren, MD      VITAL SIGNS:  Blood pressure (!) 176/66, pulse (!) 33, temperature 97.7 F (36.5 C), temperature source Oral, resp. rate (!) 30, height  (1.778 m), weight 98 kg (216 lb), SpO2 97 %.  PHYSICAL EXAMINATION:  GENERAL:   76 y.o.-year-old patient lying in the bed with no acute distress.  EYES: Pupils equal, round, reactive to light and accommodation. No scleral icterus. Extraocular muscles intact.  HEENT: Head atraumatic, normocephalic. Oropharynx and nasopharynx clear.  NECK:  Supple, no jugular venous distention. No thyroid enlargement, no tenderness.  LUNGS: Diminished breath sounds bilaterally, diffuse wheezing,  norales,rhonchi or crepitation. No use of accessory muscles of respiration. On BiPAP CARDIOVASCULAR: S1, S2 normal. No murmurs, rubs, or gallops.  ABDOMEN: Soft, nontender, nondistended. Bowel sounds present. No organomegaly or mass.  EXTREMITIES: No pedal edema, cyanosis, or clubbing.  NEUROLOGIC: Cranial nerves II through XII are intact. Muscle strength 5/5 in all extremities. Sensation intact. Gait not checked.  PSYCHIATRIC: The patient is alert and oriented x 3.  SKIN: No obvious rash, lesion, or ulcer.   LABORATORY PANEL:   CBC  Recent Labs Lab 10/22/16 1442  WBC 8.0  HGB 10.9*  HCT 32.8*  PLT 153   ------------------------------------------------------------------------------------------------------------------  Chemistries   Recent Labs Lab 10/22/16 1442  NA 139  K 4.3  CL 99*  CO2 28  GLUCOSE 207*  BUN 13  CREATININE 1.27*  CALCIUM 8.3*  AST 28  ALT 17  ALKPHOS 64  BILITOT 0.6   ------------------------------------------------------------------------------------------------------------------  Cardiac Enzymes  Recent Labs Lab 10/22/16 1442  TROPONINI <0.03   ------------------------------------------------------------------------------------------------------------------  RADIOLOGY:  Dg Chest Portable 1 View  Result Date: 10/22/2016 CLINICAL DATA:  Shortness of breath. EXAM: PORTABLE CHEST 1 VIEW COMPARISON:  Radiographs of July 05, 2016. FINDINGS: The heart size and mediastinal contours are within normal limits. Both lungs are clear. No pneumothorax or  pleural effusion is noted. Atherosclerosis of thoracic aorta is noted. The visualized skeletal structures are unremarkable. IMPRESSION: No acute cardiopulmonary abnormality seen.  Aortic atherosclerosis. Electronically Signed   By: Lupita Raider, M.D.   On: 10/22/2016 15:19    EKG:   Orders placed or performed during the hospital encounter of 10/22/16  . ED EKG  . ED EKG    IMPRESSION AND PLAN:  Jeremy Wells  is a 76 y.o. 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 Patient was severely short of breath even after 3 nebulizer treatments, Solu-Medrol and magnesium. Patient is placed on BiPAP   #Acute respiratory failure severity to COPD exacerbation Admit to stepdown unit On BiPAP Consult placed and discussed with the intensivist Dr. Sherene Sires Nebulizer treatments every 6 hours with duo nebs and albuterol as needed Patient has received magnesium sulfate IV in the ED Solu-Medrol 60 g IV every 6 hours  #Diabetes mellitus Check hemoglobin A1c Currently patient is nothing by mouth as he is on BiPAP Moderate dose  sliding scale insulin as patient is on steroids   #Acute kidney injury Hydrated with IV fluids and avoid nephrotoxins repeat a.m. Labs  #Chronic history of COPD Will resume his home inhalers as patient is medically stable   GI prophylaxis with Protonix and DVT prophylaxis with Lovenox subcutaneous   All the records are reviewed and case discussed with ED provider. Management plans discussed with the patient, family and they are in agreement.  CODE STATUS: Full code, daughter is the healthcare power of attorney  TOTAL CRITICAL CARE TIME TAKING CARE OF THIS PATIENT: 45 minutes.   Note: This dictation was prepared with Dragon dictation along with smaller phrase technology. Any transcriptional errors that result  from this process are unintentional.  Ramonita Lab M.D on 10/22/2016 at 4:41 PM  Between 7am to 6pm - Pager - [phone]  After 6pm go to [email or web address] - password [email or web address] [facility]  [facility]  Office  [phone]  CC: Primary care physician; Danella Penton, MD

## 2016-10-22 NOTE — ED Triage Notes (Signed)
Pt c/o increased SOB for the past 4-5 days, has a hx of COPD and is on continuous O2 at home at 4-5L [place]. Pt has noted labored breathing with audible wheezing.

## 2016-10-23 DIAGNOSIS — J9601 Acute respiratory failure with hypoxia: Secondary | ICD-10-CM

## 2016-10-23 LAB — CBC
HEMATOCRIT: 30.6 % — AB (ref 40.0–52.0)
HEMOGLOBIN: 10.2 g/dL — AB (ref 13.0–18.0)
MCH: 29.6 pg (ref 26.0–34.0)
MCHC: 33.5 g/dL (ref 32.0–36.0)
MCV: 88.2 fL (ref 80.0–100.0)
Platelets: 134 10*3/uL — ABNORMAL LOW (ref 150–440)
RBC: 3.46 MIL/uL — AB (ref 4.40–5.90)
RDW: 14.1 % (ref 11.5–14.5)
WBC: 5 10*3/uL (ref 3.8–10.6)

## 2016-10-23 LAB — GLUCOSE, CAPILLARY
GLUCOSE-CAPILLARY: 299 mg/dL — AB (ref 65–99)
GLUCOSE-CAPILLARY: 193 mg/dL — AB (ref 65–99)
GLUCOSE-CAPILLARY: 220 mg/dL — AB (ref 65–99)
Glucose-Capillary: 216 mg/dL — ABNORMAL HIGH (ref 65–99)

## 2016-10-23 LAB — COMPREHENSIVE METABOLIC PANEL
ALBUMIN: 3.1 g/dL — AB (ref 3.5–5.0)
ALK PHOS: 60 U/L (ref 38–126)
ALT: 16 U/L — ABNORMAL LOW (ref 17–63)
AST: 15 U/L (ref 15–41)
Anion gap: 9 (ref 5–15)
BILIRUBIN TOTAL: 0.5 mg/dL (ref 0.3–1.2)
BUN: 16 mg/dL (ref 6–20)
CALCIUM: 8 mg/dL — AB (ref 8.9–10.3)
CO2: 30 mmol/L (ref 22–32)
Chloride: 100 mmol/L — ABNORMAL LOW (ref 101–111)
Creatinine, Ser: 1.17 mg/dL (ref 0.61–1.24)
GFR calc Af Amer: >60 mL/min (ref >60)
GFR calc non Af Amer: 59 mL/min — ABNORMAL LOW (ref >60)
GLUCOSE: 285 mg/dL — AB (ref 65–99)
Potassium: 4.8 mmol/L (ref 3.5–5.1)
Sodium: 139 mmol/L (ref 135–145)
TOTAL PROTEIN: 6.5 g/dL (ref 6.5–8.1)

## 2016-10-23 MED ORDER — METOPROLOL TARTRATE 5 MG/5ML IV SOLN: 5.0000 mg | Freq: Once | INTRAVENOUS | Status: DC

## 2016-10-23 MED ORDER — MORPHINE SULFATE ER 30 MG PO TBCR
30.0000 mg | EXTENDED_RELEASE_TABLET | Freq: Three times a day (TID) | ORAL | Status: DC
  Administered 2016-10-23 – 2016-10-26 (×9): 30 mg via ORAL
  Filled 2016-10-23: qty 2
  Filled 2016-10-23 (×3): qty 1
  Filled 2016-10-23: qty 2
  Filled 2016-10-23: qty 1
  Filled 2016-10-23: qty 2
  Filled 2016-10-23: qty 1
  Filled 2016-10-23: qty 2

## 2016-10-23 MED ORDER — METOPROLOL TARTRATE 5 MG/5ML IV SOLN
5.0000 mg | INTRAVENOUS | Status: AC
  Administered 2016-10-23: 5 mg via INTRAVENOUS
  Filled 2016-10-23: qty 5

## 2016-10-23 MED ORDER — METHYLPREDNISOLONE SODIUM SUCC 40 MG IJ SOLR
40.0000 mg | Freq: Three times a day (TID) | INTRAMUSCULAR | Status: DC
  Administered 2016-10-23 – 2016-10-24 (×3): 40 mg via INTRAVENOUS
  Filled 2016-10-23 (×3): qty 1

## 2016-10-23 MED ORDER — APIXABAN 5 MG PO TABS
5.0000 mg | ORAL_TABLET | Freq: Two times a day (BID) | ORAL | Status: DC
  Administered 2016-10-23 – 2016-10-26 (×7): 5 mg via ORAL
  Filled 2016-10-23 (×7): qty 1

## 2016-10-23 MED ORDER — DILTIAZEM HCL 100 MG IV SOLR
5.0000 mg/h | INTRAVENOUS | Status: DC
  Administered 2016-10-23: 5 mg/h via INTRAVENOUS
  Administered 2016-10-23: 15 mg/h via INTRAVENOUS
  Filled 2016-10-23 (×2): qty 100

## 2016-10-23 MED ORDER — MORPHINE SULFATE (PF) 2 MG/ML IV SOLN
0.5000 mg | Freq: Once | INTRAVENOUS | Status: AC
  Administered 2016-10-23: 0.5 mg via INTRAVENOUS
  Filled 2016-10-23: qty 1

## 2016-10-23 MED ORDER — DILTIAZEM LOAD VIA INFUSION
10.0000 mg | Freq: Once | INTRAVENOUS | Status: AC
  Administered 2016-10-23: 10 mg via INTRAVENOUS
  Filled 2016-10-23: qty 10

## 2016-10-23 NOTE — Progress Notes (Signed)
C/O pain in lower extremities and is requesting morphine for pain control.  Since no pain meds ordered, spoke with [place], NP about request.  New orders received.  Will continue to monitor.

## 2016-10-23 NOTE — Progress Notes (Signed)
Inpatient Diabetes Program Recommendations  AACE/ADA: New Consensus Statement on Inpatient Glycemic Control (2015)  Target Ranges:  Prepandial:   less than 140 mg/dL      Peak postprandial:   less than 180 mg/dL (1-2 hours)      Critically ill patients:  140 - 180 mg/dL   Lab Results  Component Value Date   GLUCAP 216 (H) 10/23/2016   HGBA1C 8.7 (H) 07/05/2016    Review of Glycemic Control  Results for GEO, SLONE SR. (MRN [id number]) as of 10/23/2016 11:03  Ref. Range 10/22/2016 17:17 10/22/2016 21:32 10/23/2016 07:48  Glucose-Capillary Latest Ref Range: 65 - 99 mg/dL 865 (H) 784 (H) 696 (H)    Diabetes history: Type 2 Outpatient Diabetes medications: Glucotrol  bid, Metformin  bid Current orders for Inpatient glycemic control: Novolog 0-15 units tid, Novolog 0-5 units qhs   * solumedrol  q8h  Inpatient Diabetes Program Recommendations: Consider increasing Novolog to resistant correction and add Novolog 2 units tid with meals (hold if he eats less than 50%).   Consider adding Lantus 10 units (0.1unit/kg) while patient is on steroids.   Susette Racer, RN, BA, MHA, CDE Diabetes Coordinator Inpatient Diabetes Program  [phone] (Team Pager) [phone] [facility] Office) 10/23/2016 11:10 AM

## 2016-10-23 NOTE — Care Management (Signed)
Patient with chronic home 02 admitted to icu stepdown due to need for continuous bipap.  Currently back on nasal cannula and anticipate transfer out of icu

## 2016-10-23 NOTE — Progress Notes (Signed)
[employer] - [place] at [facility]                                                                                                                                                                                  Patient Demographics   Jeremy Wells, is a 76 y.o. male, DOB - [birth year], ZOX:[id number]  Admit date - 10/22/2016   Admitting Physician Ramonita Lab, MD  Outpatient Primary MD for the patient is Danella Penton, MD   LOS - 1  Subjective: Patient's breathing is improved. He is currently on 3 L she is off his BiPAP. Feeling much better.    Review of Systems:   CONSTITUTIONAL: No documented fever. No fatigue, weakness. No weight gain, no weight loss.  EYES: No blurry or double vision.  ENT: No tinnitus. No postnasal drip. No redness of the oropharynx.  RESPIRATORY: Positive cough, no wheeze, no hemoptysis. No dyspnea.  CARDIOVASCULAR: No chest pain. No orthopnea. No palpitations. No syncope.  GASTROINTESTINAL: No nausea, no vomiting or diarrhea. No abdominal pain. No melena or hematochezia.  GENITOURINARY: No dysuria or hematuria.  ENDOCRINE: No polyuria or nocturia. No heat or cold intolerance.  HEMATOLOGY: No anemia. No bruising. No bleeding.  INTEGUMENTARY: No rashes. No lesions.  MUSCULOSKELETAL: No arthritis. No swelling. No gout.  NEUROLOGIC: No numbness, tingling, or ataxia. No seizure-type activity.  PSYCHIATRIC: No anxiety. No insomnia. No ADD.    Vitals:   Vitals:   10/23/16 1203 10/23/16 1230 10/23/16 1330 10/23/16 1400  BP: 116/84 131/83 (!) 114/94 (!) 125/103  Pulse: 83 96 (!) 107 (!) 103  Resp:  (!) 26 18 (!) 33  Temp:      TempSrc:      SpO2: 98% 94% 91% 93%  Weight:      Height:        Wt Readings from Last 3 Encounters:  10/22/16 216 lb 0.8 oz (98 kg)  07/05/16 206 lb (93.4 kg)  12/31/15 209 lb 1.6 oz (94.8 kg)     Intake/Output Summary (Last 24 hours) at 10/23/16 1434 Last data filed at 10/23/16 1400  Gross per 24 hour   Intake            912.5 ml  Output             1175 ml  Net           -262.5 ml    Physical Exam:   GENERAL: Pleasant-appearing in no apparent distress.  HEAD, EYES, EARS, NOSE AND THROAT: Atraumatic, normocephalic. Extraocular muscles are intact. Pupils equal and reactive to light. Sclerae anicteric. No conjunctival injection. No oro-pharyngeal erythema.  NECK: Supple. There is no jugular  venous distention. No bruits, no lymphadenopathy, no thyromegaly.  HEART: Regular rate and rhythm,. No murmurs, no rubs, no clicks.  LUNGS: Occasional wheezing throughout both lungs no sensory muscle usage ABDOMEN: Soft, flat, nontender, nondistended. Has good bowel sounds. No hepatosplenomegaly appreciated.  EXTREMITIES: No evidence of any cyanosis, clubbing, or peripheral edema.  +2 pedal and radial pulses bilaterally.  NEUROLOGIC: The patient is alert, awake, and oriented x3 with no focal motor or sensory deficits appreciated bilaterally.  SKIN: Moist and warm with no rashes appreciated.  Psych: Not anxious, depressed LN: No inguinal LN enlargement    Antibiotics   Anti-infectives    Start     Dose/Rate Route Frequency Ordered Stop   10/22/16 1800  azithromycin (ZITHROMAX) 500 mg in dextrose 5 % 250 mL IVPB     500 mg 250 mL/hr over 60 Minutes Intravenous Every 24 hours 10/22/16 1702        Medications   Scheduled Meds: . apixaban  5 mg Oral BID  . budesonide (PULMICORT) nebulizer solution  0.25 mg Nebulization BID  . chlorhexidine  15 mL Mouth Rinse BID  . Influenza vac split quadrivalent PF  0.5 mL Intramuscular Tomorrow-1000  . insulin aspart  0-15 Units Subcutaneous TID WC  . insulin aspart  0-5 Units Subcutaneous QHS  . ipratropium-albuterol  3 mL Nebulization Q4H  . mouth rinse  15 mL Mouth Rinse q12n4p  . methylPREDNISolone (SOLU-MEDROL) injection  40 mg Intravenous Q8H  . pantoprazole  40 mg Oral BID AC   Continuous Infusions: . azithromycin Stopped (10/22/16 1851)  .  diltiazem (CARDIZEM) infusion 15 mg/hr (10/23/16 1210)   PRN Meds:.acetaminophen **OR** acetaminophen, albuterol, ondansetron **OR** ondansetron (ZOFRAN) IV   Data Review:   Micro Results Recent Results (from the past 240 hour(s))  MRSA PCR Screening     Status: None   Collection Time: 10/22/16  5:13 PM  Result Value Ref Range Status   MRSA by PCR NEGATIVE NEGATIVE Final    Comment:        The GeneXpert MRSA Assay (FDA approved for NASAL specimens only), is one component of a comprehensive MRSA colonization surveillance program. It is not intended to diagnose MRSA infection nor to guide or monitor treatment for MRSA infections.     Radiology Reports Dg Chest Portable 1 View  Result Date: 10/22/2016 CLINICAL DATA:  Shortness of breath. EXAM: PORTABLE CHEST 1 VIEW COMPARISON:  Radiographs of July 05, 2016. FINDINGS: The heart size and mediastinal contours are within normal limits. Both lungs are clear. No pneumothorax or pleural effusion is noted. Atherosclerosis of thoracic aorta is noted. The visualized skeletal structures are unremarkable. IMPRESSION: No acute cardiopulmonary abnormality seen.  Aortic atherosclerosis. Electronically Signed   By: Lupita Raider, M.D.   On: 10/22/2016 15:19     CBC  Recent Labs Lab 10/22/16 1442 10/23/16 0330  WBC 8.0 5.0  HGB 10.9* 10.2*  HCT 32.8* 30.6*  PLT 153 134*  MCV 88.1 88.2  MCH 29.3 29.6  MCHC 33.3 33.5  RDW 14.5 14.1    Chemistries   Recent Labs Lab 10/22/16 1442 10/23/16 0330  NA 139 139  K 4.3 4.8  CL 99* 100*  CO2 28 30  GLUCOSE 207* 285*  BUN 13 16  CREATININE 1.27* 1.17  CALCIUM 8.3* 8.0*  AST 28 15  ALT 17 16*  ALKPHOS 64 60  BILITOT 0.6 0.5   ------------------------------------------------------------------------------------------------------------------ estimated creatinine clearance is 64 mL/min (by C-G formula based on SCr of 1.17  mg/dL). ------------------------------------------------------------------------------------------------------------------ No results for input(s): HGBA1C in the last 72 hours. ------------------------------------------------------------------------------------------------------------------ No results for input(s): CHOL, HDL, LDLCALC, TRIG, CHOLHDL, LDLDIRECT in the last 72 hours. ------------------------------------------------------------------------------------------------------------------ No results for input(s): TSH, T4TOTAL, T3FREE, THYROIDAB in the last 72 hours.  Invalid input(s): FREET3 ------------------------------------------------------------------------------------------------------------------ No results for input(s): VITAMINB12, FOLATE, FERRITIN, TIBC, IRON, RETICCTPCT in the last 72 hours.  Coagulation profile No results for input(s): INR, PROTIME in the last 168 hours.  No results for input(s): DDIMER in the last 72 hours.  Cardiac Enzymes  Recent Labs Lab 10/22/16 1442  TROPONINI <0.03   ------------------------------------------------------------------------------------------------------------------ Invalid input(s): POCBNP    Assessment & Plan   Jeremy Wells  is a 76 y.o. 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 Patient was severely short of breath even after 3 nebulizer treatments, Solu-Medrol and magnesium. Patient is placed on BiPAP   #Acute respiratory failure severity to COPD exacerbation Patient's breathing is improved We will transfer to floor Continue therapy with nebulizers An IV Solu-Medrol   #Diabetes mellitus Continue sliding scale insulin Resume medication with Glucotrol and metformin   #Acute kidney injury Stable continue to monitor  #Chronic pain we'll resume  his home regimen  GI prophylaxis with Protonix and DVT prophylaxis with Lovenox subcutaneous      Code Status Orders        Start     Ordered   10/22/16 1718  Full code  Continuous     10/22/16 1717    Code Status History    Date Active Date Inactive Code Status Order ID Comments User Context   07/05/2016  6:19 PM 07/07/2016  3:06 PM Full Code [id number]  Enid Baas, MD Inpatient    Advance Directive Documentation     Most Recent Value  Type of Advance Directive  Healthcare Power of Attorney, Living will  Pre-existing out of facility DNR order (yellow form or pink MOST form)  -  "MOST" Form in Place?  -           Consults  Pulmonary critical care   DVT Prophylaxis  Lovenox  Lab Results  Component Value Date   PLT 134 (L) 10/23/2016     Time Spent in minutes    Greater than 50% of time spent in care coordination and counseling patient regarding the condition and plan of care.   Auburn Bilberry M.D on 10/23/2016 at 2:34 PM  Between 7am to 6pm - Pager - [phone]  After 6pm go to [email or web address] - password [email or web address] [facility]  [facility] [place] [facility]   Office  [phone]

## 2016-10-23 NOTE — Progress Notes (Signed)
Pt currently on [facility] without any complications. Sao2 97%.

## 2016-10-23 NOTE — Progress Notes (Signed)
RN made Annabelle Harman, NP aware that patient has had a rhythm change from NSR to what appears to be Afib rate 1 teens to 150's.  NP stated that she would order an EKG.

## 2016-10-23 NOTE — Progress Notes (Addendum)
RN showed Annabelle Harman, NP patient's EKG and made her aware that patient's heart rate is staying in 130-160 afib. NP gave order for 5 mg of Lopressor for now and stated she would look at his chart.

## 2016-10-23 NOTE — Progress Notes (Signed)
ANTICOAGULATION CONSULT NOTE - Initial Consult  Pharmacy Consult for apixaban Indication: atrial fibrillation  Allergies  Allergen Reactions  . Ambien [Zolpidem] Nausea And Vomiting  . Codeine Nausea And Vomiting    Patient Measurements: Height:  (177.8 cm) Weight: 216 lb 0.8 oz (98 kg) IBW/kg (Calculated) : 73  Vital Signs: Temp: 97.7 F (36.5 C) (09/21 0739) Temp Source: Oral (09/21 0739) BP: 130/119 (09/21 1003) Pulse Rate: 145 (09/21 1003)  Labs:  Recent Labs  10/22/16 1442 10/23/16 0330  HGB 10.9* 10.2*  HCT 32.8* 30.6*  PLT 153 134*  CREATININE 1.27* 1.17  TROPONINI <0.03  --     Estimated Creatinine Clearance: 64 mL/min (by C-G formula based on SCr of 1.17 mg/dL).   Medical History: Past Medical History:  Diagnosis Date  . Chronic pain   . COPD (chronic obstructive pulmonary disease) (HCC)    on 4L home oxygen  . Diabetes mellitus without complication (HCC)   . Emphysema/COPD [facility])     Medications:  9/20 Enoxaparin >> 9/20  Assessment: JC is a 8 YOM with known history of COPD, DM, chronic pain syndrome that presented to the ED with SOB. Patient admitted to the ICU where he experienced new onset A. Fib. CHADS-VASC score at least 3 because age greater than or equal to 75 and history of diabetes.   Plan:  Discontinue Enoxaparin  Initiate Apixaban 5 mg BID   Dwain Sarna, PharmD Student 10/23/2016,10:58 AM

## 2016-10-24 LAB — GLUCOSE, CAPILLARY
GLUCOSE-CAPILLARY: 244 mg/dL — AB (ref 65–99)
GLUCOSE-CAPILLARY: 223 mg/dL — AB (ref 65–99)
GLUCOSE-CAPILLARY: 353 mg/dL — AB (ref 65–99)
GLUCOSE-CAPILLARY: 159 mg/dL — AB (ref 65–99)

## 2016-10-24 LAB — CBC
HCT: 29.5 % — ABNORMAL LOW (ref 40.0–52.0)
Hemoglobin: 9.7 g/dL — ABNORMAL LOW (ref 13.0–18.0)
MCH: 28.9 pg (ref 26.0–34.0)
MCHC: 33 g/dL (ref 32.0–36.0)
MCV: 87.7 fL (ref 80.0–100.0)
Platelets: 155 10*3/uL (ref 150–440)
RBC: 3.37 MIL/uL — AB (ref 4.40–5.90)
RDW: 14.1 % (ref 11.5–14.5)
WBC: 12.3 10*3/uL — AB (ref 3.8–10.6)

## 2016-10-24 LAB — BASIC METABOLIC PANEL
ANION GAP: 7 (ref 5–15)
BUN: 27 mg/dL — ABNORMAL HIGH (ref 6–20)
CALCIUM: 7.9 mg/dL — AB (ref 8.9–10.3)
CO2: 31 mmol/L (ref 22–32)
Chloride: 101 mmol/L (ref 101–111)
Creatinine, Ser: 1.35 mg/dL — ABNORMAL HIGH (ref 0.61–1.24)
GFR, EST AFRICAN AMERICAN: 58 mL/min — AB (ref >60)
GFR, EST NON AFRICAN AMERICAN: 50 mL/min — AB (ref >60)
Glucose, Bld: 215 mg/dL — ABNORMAL HIGH (ref 65–99)
POTASSIUM: 4.7 mmol/L (ref 3.5–5.1)
SODIUM: 139 mmol/L (ref 135–145)

## 2016-10-24 LAB — MAGNESIUM: MAGNESIUM: 1.6 mg/dL — AB (ref 1.7–2.4)

## 2016-10-24 LAB — HEMOGLOBIN A1C
Hgb A1c MFr Bld: 7.8 % — ABNORMAL HIGH (ref 4.8–5.6)
MEAN PLASMA GLUCOSE: 177.16 mg/dL

## 2016-10-24 MED ORDER — GLIPIZIDE 10 MG PO TABS
10.0000 mg | ORAL_TABLET | Freq: Two times a day (BID) | ORAL | Status: DC
  Administered 2016-10-24 – 2016-10-26 (×4): 10 mg via ORAL
  Filled 2016-10-24 (×4): qty 1

## 2016-10-24 MED ORDER — DILTIAZEM HCL 30 MG PO TABS: 90.0000 mg | ORAL_TABLET | Freq: Three times a day (TID) | ORAL | Status: DC

## 2016-10-24 MED ORDER — DILTIAZEM HCL 100 MG IV SOLR
5.0000 mg/h | INTRAVENOUS | Status: AC
  Filled 2016-10-24: qty 100

## 2016-10-24 MED ORDER — METHYLPREDNISOLONE SODIUM SUCC 40 MG IJ SOLR
40.0000 mg | INTRAMUSCULAR | Status: DC
  Administered 2016-10-25: 40 mg via INTRAVENOUS
  Filled 2016-10-24: qty 1

## 2016-10-24 MED ORDER — ASPIRIN EC 81 MG PO TBEC
81.0000 mg | DELAYED_RELEASE_TABLET | Freq: Every day | ORAL | Status: DC
  Administered 2016-10-24 – 2016-10-26 (×3): 81 mg via ORAL
  Filled 2016-10-24 (×3): qty 1

## 2016-10-24 MED ORDER — IPRATROPIUM-ALBUTEROL 0.5-2.5 (3) MG/3ML IN SOLN
3.0000 mL | Freq: Four times a day (QID) | RESPIRATORY_TRACT | Status: DC
  Administered 2016-10-24 – 2016-10-26 (×6): 3 mL via RESPIRATORY_TRACT
  Filled 2016-10-24 (×6): qty 3

## 2016-10-24 MED ORDER — MONTELUKAST SODIUM 10 MG PO TABS
10.0000 mg | ORAL_TABLET | Freq: Every day | ORAL | Status: DC
  Administered 2016-10-24 – 2016-10-25 (×2): 10 mg via ORAL
  Filled 2016-10-24 (×2): qty 1

## 2016-10-24 MED ORDER — GUAIFENESIN ER 600 MG PO TB12
600.0000 mg | ORAL_TABLET | Freq: Two times a day (BID) | ORAL | Status: DC
  Administered 2016-10-24 – 2016-10-26 (×5): 600 mg via ORAL
  Filled 2016-10-24 (×5): qty 1

## 2016-10-24 MED ORDER — METFORMIN HCL 500 MG PO TABS
1000.0000 mg | ORAL_TABLET | Freq: Two times a day (BID) | ORAL | Status: DC
  Administered 2016-10-24 – 2016-10-26 (×4): 1000 mg via ORAL
  Filled 2016-10-24 (×4): qty 2

## 2016-10-24 MED ORDER — MORPHINE SULFATE 30 MG PO TABS: 30.0000 mg | ORAL_TABLET | Freq: Three times a day (TID) | ORAL | Status: DC

## 2016-10-24 MED ORDER — DILTIAZEM HCL 30 MG PO TABS
30.0000 mg | ORAL_TABLET | Freq: Three times a day (TID) | ORAL | Status: DC
  Administered 2016-10-24 – 2016-10-26 (×6): 30 mg via ORAL
  Filled 2016-10-24 (×7): qty 1

## 2016-10-24 MED ORDER — PANTOPRAZOLE SODIUM 40 MG PO TBEC: 40.0000 mg | DELAYED_RELEASE_TABLET | Freq: Every day | ORAL | Status: DC

## 2016-10-24 NOTE — Progress Notes (Signed)
[employer] - [place] at [facility]                                                                                                                                                                                  Patient Demographics   Jeremy Wells, is a 76 y.o. male, DOB - 1940-02-17, ZOX:[id number]  Admit date - 10/22/2016   Admitting Physician Ramonita Lab, MD  Outpatient Primary MD for the patient is Danella Penton, MD   LOS - 2  Subjective: Patient was on Cardizem drip due to A. Fib with RVR. Heart rate currently stable. States that his breathing is improved. But continues to be short of breath    Review of Systems:   CONSTITUTIONAL: No documented fever. No fatigue, weakness. No weight gain, no weight loss.  EYES: No blurry or double vision.  ENT: No tinnitus. No postnasal drip. No redness of the oropharynx.  RESPIRATORY: Positive cough, no wheeze, no hemoptysis. No dyspnea.  CARDIOVASCULAR: No chest pain. No orthopnea. No palpitations. No syncope.  GASTROINTESTINAL: No nausea, no vomiting or diarrhea. No abdominal pain. No melena or hematochezia.  GENITOURINARY: No dysuria or hematuria.  ENDOCRINE: No polyuria or nocturia. No heat or cold intolerance.  HEMATOLOGY: No anemia. No bruising. No bleeding.  INTEGUMENTARY: No rashes. No lesions.  MUSCULOSKELETAL: No arthritis. No swelling. No gout.  NEUROLOGIC: No numbness, tingling, or ataxia. No seizure-type activity.  PSYCHIATRIC: No anxiety. No insomnia. No ADD.    Vitals:   Vitals:   10/24/16 0749 10/24/16 0800 10/24/16 1300 10/24/16 1401  BP:   135/76   Pulse:   79   Resp:   (!) 24   Temp:  98.1 F (36.7 C)    TempSrc:      SpO2: 95%  94% 94%  Weight:      Height:        Wt Readings from Last 3 Encounters:  10/22/16 216 lb 0.8 oz (98 kg)  07/05/16 206 lb (93.4 kg)  12/31/15 209 lb 1.6 oz (94.8 kg)     Intake/Output Summary (Last 24 hours) at 10/24/16 1455 Last data filed at 10/24/16  1228  Gross per 24 hour  Intake              355 ml  Output              650 ml  Net             -295 ml    Physical Exam:   GENERAL: Pleasant-appearing in no apparent distress.  HEAD, EYES, EARS, NOSE AND THROAT: Atraumatic, normocephalic. Extraocular muscles are intact. Pupils equal and reactive to light. Sclerae anicteric.  No conjunctival injection. No oro-pharyngeal erythema.  NECK: Supple. There is no jugular venous distention. No bruits, no lymphadenopathy, no thyromegaly.  HEART: irregularly irregular,. No murmurs, no rubs, no clicks.  LUNGS: Occasional wheezing throughout both lungs no sensory muscle usage ABDOMEN: Soft, flat, nontender, nondistended. Has good bowel sounds. No hepatosplenomegaly appreciated.  EXTREMITIES: No evidence of any cyanosis, clubbing, or peripheral edema.  +2 pedal and radial pulses bilaterally.  NEUROLOGIC: The patient is alert, awake, and oriented x3 with no focal motor or sensory deficits appreciated bilaterally.  SKIN: Moist and warm with no rashes appreciated.  Psych: Not anxious, depressed LN: No inguinal LN enlargement    Antibiotics   Anti-infectives    Start     Dose/Rate Route Frequency Ordered Stop   10/22/16 1800  azithromycin (ZITHROMAX) 500 mg in dextrose 5 % 250 mL IVPB     500 mg 250 mL/hr over 60 Minutes Intravenous Every 24 hours 10/22/16 1702        Medications   Scheduled Meds: . apixaban  5 mg Oral BID  . budesonide (PULMICORT) nebulizer solution  0.25 mg Nebulization BID  . chlorhexidine  15 mL Mouth Rinse BID  . diltiazem  30 mg Oral Q8H  . guaiFENesin  600 mg Oral BID  . Influenza vac split quadrivalent PF  0.5 mL Intramuscular Tomorrow-1000  . insulin aspart  0-15 Units Subcutaneous TID WC  . insulin aspart  0-5 Units Subcutaneous QHS  . ipratropium-albuterol  3 mL Nebulization Q6H  . mouth rinse  15 mL Mouth Rinse q12n4p  . [START ON 10/25/2016] methylPREDNISolone (SOLU-MEDROL) injection  40 mg Intravenous Q24H   . morphine  30 mg Oral Q8H  . pantoprazole  40 mg Oral BID AC   Continuous Infusions: . azithromycin Stopped (10/23/16 1948)  . diltiazem (CARDIZEM) infusion     PRN Meds:.acetaminophen **OR** acetaminophen, albuterol, ondansetron **OR** ondansetron (ZOFRAN) IV   Data Review:   Micro Results Recent Results (from the past 240 hour(s))  MRSA PCR Screening     Status: None   Collection Time: 10/22/16  5:13 PM  Result Value Ref Range Status   MRSA by PCR NEGATIVE NEGATIVE Final    Comment:        The GeneXpert MRSA Assay (FDA approved for NASAL specimens only), is one component of a comprehensive MRSA colonization surveillance program. It is not intended to diagnose MRSA infection nor to guide or monitor treatment for MRSA infections.     Radiology Reports Dg Chest Portable 1 View  Result Date: 10/22/2016 CLINICAL DATA:  Shortness of breath. EXAM: PORTABLE CHEST 1 VIEW COMPARISON:  Radiographs of July 05, 2016. FINDINGS: The heart size and mediastinal contours are within normal limits. Both lungs are clear. No pneumothorax or pleural effusion is noted. Atherosclerosis of thoracic aorta is noted. The visualized skeletal structures are unremarkable. IMPRESSION: No acute cardiopulmonary abnormality seen.  Aortic atherosclerosis. Electronically Signed   By: Lupita Raider, M.D.   On: 10/22/2016 15:19     CBC  Recent Labs Lab 10/22/16 1442 10/23/16 0330 10/24/16 0449  WBC 8.0 5.0 12.3*  HGB 10.9* 10.2* 9.7*  HCT 32.8* 30.6* 29.5*  PLT 153 134* 155  MCV 88.1 88.2 87.7  MCH 29.3 29.6 28.9  MCHC 33.3 33.5 33.0  RDW 14.5 14.1 14.1    Chemistries   Recent Labs Lab 10/22/16 1442 10/23/16 0330 10/24/16 0449  NA 139 139 139  K 4.3 4.8 4.7  CL 99* 100* 101  CO2 28  30 31  GLUCOSE 207* 285* 215*  BUN 13 16 27*  CREATININE 1.27* 1.17 1.35*  CALCIUM 8.3* 8.0* 7.9*  MG  --   --  1.6*  AST 28 15  --   ALT 17 16*  --   ALKPHOS 64 60  --   BILITOT 0.6 0.5  --     ------------------------------------------------------------------------------------------------------------------ estimated creatinine clearance is 55.5 mL/min (A) (by C-G formula based on SCr of 1.35 mg/dL (H)). ------------------------------------------------------------------------------------------------------------------  Recent Labs  10/23/16 0330  HGBA1C 7.8*   ------------------------------------------------------------------------------------------------------------------ No results for input(s): CHOL, HDL, LDLCALC, TRIG, CHOLHDL, LDLDIRECT in the last 72 hours. ------------------------------------------------------------------------------------------------------------------ No results for input(s): TSH, T4TOTAL, T3FREE, THYROIDAB in the last 72 hours.  Invalid input(s): FREET3 ------------------------------------------------------------------------------------------------------------------ No results for input(s): VITAMINB12, FOLATE, FERRITIN, TIBC, IRON, RETICCTPCT in the last 72 hours.  Coagulation profile No results for input(s): INR, PROTIME in the last 168 hours.  No results for input(s): DDIMER in the last 72 hours.  Cardiac Enzymes  Recent Labs Lab 10/22/16 1442  TROPONINI <0.03   ------------------------------------------------------------------------------------------------------------------ Invalid input(s): POCBNP    Assessment & Plan   Jeremy Wells  is a 76 y.o. 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 Patient was severely short of breath even after 3 nebulizer treatments, Solu-Medrol and magnesium. Patient is placed on BiPAP   #Acute respiratory failure severity to COPD exacerbation Patient's breathing is improved Continue therapy with nebulizers Continue IV Solu-Medrol  #afib-  discontinue IV Cardizem patient will be started on oral Cardizem  #Diabetes mellitus Continue sliding scale insulin Resume medication with Glucotrol and metformin Sugar elevated due to steroid  #Acute kidney injury Stable continue to monitor  #Chronic pain continue his home regimen  GI prophylaxis with Protonix and DVT prophylaxis with Lovenox subcutaneous      Code Status Orders        Start     Ordered   10/22/16 1718  Full code  Continuous     10/22/16 1717    Code Status History    Date Active Date Inactive Code Status Order ID Comments User Context   07/05/2016  6:19 PM 07/07/2016  3:06 PM Full Code [id number]  Enid Baas, MD Inpatient    Advance Directive Documentation     Most Recent Value  Type of Advance Directive  Healthcare Power of Attorney, Living will  Pre-existing out of facility DNR order (yellow form or pink MOST form)  -  "MOST" Form in Place?  -           Consults  Pulmonary critical care   DVT Prophylaxis  Lovenox  Lab Results  Component Value Date   PLT 155 10/24/2016     Time Spent in minutes    Greater than 50% of time spent in care coordination and counseling patient regarding the condition and plan of care.   Auburn Bilberry M.D on 10/24/2016 at 2:55 PM  Between 7am to 6pm - Pager - [phone]  After 6pm go to [email or web address] - password [email or web address] [facility]  [facility] [place] [facility]   Office  [phone]

## 2016-10-24 NOTE — Progress Notes (Signed)
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  Anemia without acute blood loss  Hyperglycemia due to steroids.  P: Change cardizem to PO, continue anticoagulation.  Continue scheduled and prn bronchodilator therapy Continue iv and nebulized steroids  Prn CXR and ABG's Trend WBC and monitor fever curve IV Azithromycin  Wean down steroids.    Date: 10/24/2016  MRN# [id number] Jeremy HOLZHAUER Sr. 01-29-1941   Jeremy Planas Sr. is a 76 y.o. old male seen in follow up for chief complaint of  Chief Complaint  Patient presents with  . Shortness of Breath     HPI:   No new complaints today, pt is feeling better.   Medication:    Current Facility-Administered Medications:  .  acetaminophen (TYLENOL) tablet 650 mg, 650 mg, Oral, Q6H PRN **OR** acetaminophen (TYLENOL) suppository 650 mg, 650 mg, Rectal, Q6H PRN, Gouru, Aruna, MD .  albuterol (PROVENTIL) (2.5 MG/3ML) 0.083% nebulizer solution 2.5 mg, 2.5 mg, Nebulization, Q4H PRN, Gouru, Aruna, MD .  apixaban (ELIQUIS) tablet 5 mg, 5 mg, Oral, BID, Auburn Bilberry, MD, 5 mg at 10/24/16 1011 .  azithromycin (ZITHROMAX) 500 mg in dextrose 5 % 250 mL IVPB, 500 mg, Intravenous, Q24H, Eugenie Norrie, NP, Stopped at 10/23/16 1948 .  budesonide (PULMICORT) nebulizer solution 0.25 mg, 0.25 mg, Nebulization, BID, Eugenie Norrie, NP, 0.25 mg at 10/24/16 0749 .  chlorhexidine (PERIDEX) 0.12 % solution 15 mL, 15 mL, Mouth Rinse, BID, Blakeney, Dana G, NP, 15 mL at 10/24/16 1010 .  diltiazem (CARDIZEM) tablet 90 mg, 90 mg, Oral, Q8H, Auburn Bilberry, MD .  guaiFENesin (MUCINEX) 12 hr tablet 600 mg, 600 mg, Oral, BID, Auburn Bilberry, MD, 600 mg at 10/24/16 1000 .  Influenza vac split quadrivalent PF (FLUZONE HIGH-DOSE) injection 0.5 mL, 0.5 mL, Intramuscular, Tomorrow-1000, Blakeney, Dana G, NP .  insulin aspart (novoLOG) injection  0-15 Units, 0-15 Units, Subcutaneous, TID WC, Gouru, Aruna, MD, 5 Units at 10/24/16 0800 .  insulin aspart (novoLOG) injection 0-5 Units, 0-5 Units, Subcutaneous, QHS, Gouru, Aruna, MD, 5 Units at 10/23/16 2326 .  ipratropium-albuterol (DUONEB) 0.5-2.5 (3) MG/3ML nebulizer solution 3 mL, 3 mL, Nebulization, Q6H, Patel, Sona, MD .  MEDLINE mouth rinse, 15 mL, Mouth Rinse, q12n4p, Blakeney, Neldon Newport, NP .  methylPREDNISolone sodium succinate (SOLU-MEDROL) 40 mg/mL injection 40 mg, 40 mg, Intravenous, Q8H, Nicholos Johns, Gazelle Towe, MD, 40 mg at 10/24/16 0556 .  morphine (MS CONTIN) 12 hr tablet 30 mg, 30 mg, Oral, Q8H, Blakeney, Dana G, NP, 30 mg at 10/24/16 0556 .  ondansetron (ZOFRAN) tablet 4 mg, 4 mg, Oral, Q6H PRN **OR** ondansetron (ZOFRAN) injection 4 mg, 4 mg, Intravenous, Q6H PRN, Gouru, Aruna, MD .  pantoprazole (PROTONIX) EC tablet 40 mg, 40 mg, Oral, BID AC, Nyoka Cowden, MD, 40 mg at 10/24/16 0800   Allergies:  Ambien [zolpidem] and Codeine  Review of Systems: Gen:  Denies  fever, sweats. HEENT: Denies blurred vision. Cvc:  No dizziness, chest pain or heaviness Resp:   Denies cough or sputum porduction. Gi: Denies swallowing difficulty, stomach pain. constipation, bowel incontinence Gu:  Denies bladder incontinence, burning urine Ext:   No Joint pain, stiffness. Skin: No skin rash, easy bruising. Endoc:  No polyuria, polydipsia. Psych: No depression, insomnia. Other:  All other systems were reviewed and found to be negative other than what is mentioned in the HPI.   Physical Examination:  VS: BP 126/68   Pulse 81   Temp 98.1 F (36.7 C)   Resp (!) 28   Ht  (1.778 m)   Wt 216 lb 0.8 oz (98 kg)   SpO2 95%   BMI 31.00 kg/m    General Appearance: No distress  Neuro:without focal findings,  speech normal,  HEENT: PERRLA, EOM intact. Pulmonary: normal breath sounds, No wheezing.   CardiovascularNormal S1,S2.  No m/r/g.   Abdomen: Benign, Soft, non-tender. Renal:   No costovertebral tenderness  GU:  Not performed at this time. Endoc: No evident thyromegaly, no signs of acromegaly. Skin:   warm, no rash. Extremities: normal, no cyanosis, clubbing.   LABORATORY PANEL:   CBC  Recent Labs Lab 10/24/16 0449  WBC 12.3*  HGB 9.7*  HCT 29.5*  PLT 155   ------------------------------------------------------------------------------------------------------------------  Chemistries   Recent Labs Lab 10/23/16 0330 10/24/16 0449  NA 139 139  K 4.8 4.7  CL 100* 101  CO2 30 31  GLUCOSE 285* 215*  BUN 16 27*  CREATININE 1.17 1.35*  CALCIUM 8.0* 7.9*  MG  --  1.6*  AST 15  --   ALT 16*  --   ALKPHOS 60  --   BILITOT 0.5  --    ------------------------------------------------------------------------------------------------------------------  Cardiac Enzymes  Recent Labs Lab 10/22/16 1442  TROPONINI <0.03   ------------------------------------------------------------  RADIOLOGY:   No results found for this or any previous visit. Results for orders placed during the hospital encounter of 07/05/16  DG Chest 2 View   Narrative CLINICAL DATA:  Patient with shortness of breath.  Wheezing.  EXAM: CHEST  2 VIEW  COMPARISON:  Chest radiograph 09/22/2013  FINDINGS: Stable cardiac and mediastinal contours. No consolidative pulmonary opacities. No pleural effusion or pneumothorax. Thoracic spine degenerative changes. Pulmonary hyperinflation.  IMPRESSION: Pulmonary hyperinflation.  No acute cardiopulmonary process.  Cardiomegaly.   Electronically Signed   By: Annia Belt M.D.   On: 07/05/2016 14:24    ------------------------------------------------------------------------------------------------------------------  Thank  you for allowing [facility] to assist in the care of your patient. Our recommendations are noted above.  Please contact us if we can be of further service.   Wells Guiles,  MD.  [facility] Office Number: [phone]  Santiago Glad, M.D.  Billy Fischer, M.D  10/24/2016

## 2016-10-24 NOTE — Progress Notes (Signed)
Pt. HR increased to 150's and converted to a-fib, upon assessing pt. He was asleep. Woke pt. Asked if he was having SOB, palpations or pain he stated no. Pt. Requested to use bathroom. Came back to desk to page prime pt. Had converted back to SR with PVC's and HR running in 80's. Paged prime, awaiting call back.

## 2016-10-24 NOTE — Progress Notes (Signed)
Central telemetry called to inform staff that patient had a 10 beat run of VT. Patient was asymptomatic. Pulled off pulse ox and told another nurse he is on 5L at home. Oxygen was turned up to 5L, but patient is on 4 L at home. When nurse asked patient about the 5L, patient said he is on 4L to 5L. Dayshift nurse stated patient is on 4L at home and has been on 2L all day. Will leave on 4L and monitor while sats are fluctuating.

## 2016-10-24 NOTE — Progress Notes (Signed)
Pt was already eating supper by the time [facility] CBG was done...reading of 353.  Discussed with Dr. Ardyth Man how to cover the sugar.  Order to cover with 5u, cont to give antiglycemics and check cbg again at HS.Marland Kitchen  Lurene Shadow, RN

## 2016-10-24 NOTE — Plan of Care (Signed)
Problem: Safety: Goal: Ability to remain free from injury will improve Outcome: Progressing  Patient's VSS throughout the shift. Patient denies pain. Patient anxious and tachypneic at times. Patient resting well. RN will continue to monitor.

## 2016-10-24 NOTE — Consult Note (Signed)
[facility] Consultation Note  Patient ID: Jeremy Wells., MRN: [id number], DOB/AGE: [birth year] 76 y.o. Admit date: 10/22/2016   Date of Consult: 10/24/2016 Primary Physician: Danella Penton, MD Primary Cardiologist: None  Chief Complaint:  Chief Complaint  Patient presents with  . Shortness of Breath   Reason for Consult: atrial fibrillation  HPI: 76 y.o. male with known diabetes and severe chronic obstructive pulmonary disease on 4 L of home oxygen and chronic pain. The patient has done relatively well in recent months with continued to pulmonary issues which have been stable on inhalers. The patient has come in with an exacerbation of COPD with worsening hypoxia cough and congestion. Upon arrival to the emergency room the patient was having a rapid heart rate which appears to be atrial fibrillation with rapid ventricular rate and right bundle branch block. The patient has not had any significant symptoms of this with no evidence of myocardial infarction or congestive heart failure. Patient was given the appropriate medication management including intravenous diltiazem and has now spontaneously converted to normal sinus rhythm. The patient does have the significant risk of stroke with atrial fibrillation and therefore would recommend the use of anticoagulation. There is no evidence of true angina or other cardiovascular symptoms today  Past Medical History:  Diagnosis Date  . Chronic pain   . COPD (chronic obstructive pulmonary disease) (HCC)    on 4L home oxygen  . Diabetes mellitus without complication (HCC)   . Emphysema/COPD [facility])       Surgical History:  Past Surgical History:  Procedure Laterality Date  . REPLACEMENT TOTAL KNEE       Home Meds: Prior to Admission medications   Medication Sig Start Date End Date Taking? Authorizing Provider  albuterol (PROVENTIL HFA;VENTOLIN HFA) 108 (90 Base) MCG/ACT inhaler Inhale 1-2 puffs into the lungs every 4  (four) hours as needed for wheezing or shortness of breath.   Yes [provider]  albuterol (PROVENTIL) (2.5 MG/3ML) 0.083% nebulizer solution Take 2.5 mg by nebulization every 4 (four) hours as needed for wheezing or shortness of breath.   Yes [provider]  aspirin EC 81 MG tablet Take 81 mg by mouth daily.   Yes [provider]  Fluticasone-Salmeterol (ADVAIR DISKUS) 250-50 MCG/DOSE AEPB Inhale 1 puff into the lungs 2 (two) times daily.   Yes [provider]  glipiZIDE (GLUCOTROL) 10 MG tablet Take 10 mg by mouth 2 (two) times daily before a meal.   Yes [provider]  metFORMIN (GLUCOPHAGE) 1000 MG tablet Take 1,000 mg by mouth 2 (two) times daily with a meal.   Yes [provider]  morphine (MSIR) 30 MG tablet Take 30 mg by mouth 3 (three) times daily.   Yes [provider]  omeprazole (PRILOSEC) 20 MG capsule Take 20 mg by mouth daily.   Yes [provider]  predniSONE (DELTASONE) 10 MG tablet Label  & dispense according to the schedule below. 5 Pills PO for 2 days then, 4 Pills PO for 2 days, 3 Pills PO for 2 days, 2 Pills PO for 2 days, 1 Pill PO for 2 days then STOP. 07/07/16  Yes Sainani, Rolly Pancake, MD  montelukast (SINGULAIR) 10 MG tablet Take 10 mg by mouth at bedtime.    [provider]  nicotine (NICODERM CQ - DOSED IN MG/24 HOURS) 14 mg/24hr patch Place 1 patch (14 mg total) onto the skin daily. Patient not taking: Reported on 10/22/2016 07/08/16  Houston Siren, MD    Inpatient Medications:  . apixaban  5 mg Oral BID  . budesonide (PULMICORT) nebulizer solution  0.25 mg Nebulization BID  . chlorhexidine  15 mL Mouth Rinse BID  . Influenza vac split quadrivalent PF  0.5 mL Intramuscular Tomorrow-1000  . insulin aspart  0-15 Units Subcutaneous TID WC  . insulin aspart  0-5 Units Subcutaneous QHS  . ipratropium-albuterol  3 mL Nebulization Q4H  . mouth rinse  15 mL Mouth Rinse q12n4p  .  methylPREDNISolone (SOLU-MEDROL) injection  40 mg Intravenous Q8H  . morphine  30 mg Oral Q8H  . pantoprazole  40 mg Oral BID AC   . azithromycin Stopped (10/23/16 1948)  . diltiazem (CARDIZEM) infusion 5 mg/hr (10/23/16 1854)    Allergies:  Allergies  Allergen Reactions  . Ambien [Zolpidem] Nausea And Vomiting  . Codeine Nausea And Vomiting    Social History   Social History  . Marital status: Married    Spouse name: N/A  . Number of children: N/A  . Years of education: N/A   Occupational History  . Not on file.   Social History Main Topics  . Smoking status: Current Every Day Smoker    Packs/day: 0.50    Types: Cigarettes  . Smokeless tobacco: Never Used  . Alcohol use No  . Drug use: No  . Sexual activity: Not on file   Other Topics Concern  . Not on file   Social History Narrative   Lives at home with family. Ambulates with a cane.     Family History  Problem Relation Age of Onset  . CVA Father      Review of Systems Positive for Cough congestion Negative for: General:  chills, fever, night sweats or weight changes.  Cardiovascular: PND orthopnea syncope dizziness  Dermatological skin lesions rashes Respiratory: Positive for Cough congestion Urologic: Frequent urination urination at night and hematuria Abdominal: negative for nausea, vomiting, diarrhea, bright red blood per rectum, melena, or hematemesis Neurologic: negative for visual changes, and/or hearing changes  All other systems reviewed and are otherwise negative except as noted above.  Labs:  Recent Labs  10/22/16 1442  TROPONINI <0.03   Lab Results  Component Value Date   WBC 12.3 (H) 10/24/2016   HGB 9.7 (L) 10/24/2016   HCT 29.5 (L) 10/24/2016   MCV 87.7 10/24/2016   PLT 155 10/24/2016    Recent Labs Lab 10/23/16 0330 10/24/16 0449  NA 139 139  K 4.8 4.7  CL 100* 101  CO2 30 31  BUN 16 27*  CREATININE 1.17 1.35*  CALCIUM 8.0* 7.9*  PROT 6.5  --   BILITOT 0.5  --    ALKPHOS 60  --   ALT 16*  --   AST 15  --   GLUCOSE 285* 215*   No results found for: CHOL, HDL, LDLCALC, TRIG No results found for: DDIMER  Radiology/Studies:  Dg Chest Portable 1 View  Result Date: 10/22/2016 CLINICAL DATA:  Shortness of breath. EXAM: PORTABLE CHEST 1 VIEW COMPARISON:  Radiographs of July 05, 2016. FINDINGS: The heart size and mediastinal contours are within normal limits. Both lungs are clear. No pneumothorax or pleural effusion is noted. Atherosclerosis of thoracic aorta is noted. The visualized skeletal structures are unremarkable. IMPRESSION: No acute cardiopulmonary abnormality seen.  Aortic atherosclerosis. Electronically Signed   By: Lupita Raider, M.D.   On: 10/22/2016 15:19    EKG: Atrial fibrillation with rapid ventricular rate right bundle branch  block  Weights: Filed Weights   10/22/16 1435 10/22/16 1715  Weight: 98 kg (216 lb) 98 kg (216 lb 0.8 oz)     Physical Exam: Blood pressure 137/84, pulse 72, temperature 98.2 F (36.8 C), temperature source Oral, resp. rate (!) 24, height  (1.778 m), weight 98 kg (216 lb 0.8 oz), SpO2 95 %. Body mass index is 31 kg/m. General: Well developed, well nourished, in no acute distress. Head eyes ears nose throat: Normocephalic, atraumatic, sclera non-icteric, no xanthomas, nares are without discharge. No apparent thyromegaly and/or mass  Lungs: Normal respiratory effort. Diffuse wheezes, no rales, some rhonchi.  Heart: RRR with normal S1 S2. no murmur gallop, no rub, PMI is normal size and placement, carotid upstroke normal without bruit, jugular venous pressure is normal Abdomen: Soft, non-tender, non-distended with normoactive bowel sounds. No hepatomegaly. No rebound/guarding. No obvious abdominal masses. Abdominal aorta is normal size without bruit Extremities: Trace edema. no cyanosis, no clubbing, no ulcers  Peripheral : 2+ bilateral upper extremity pulses, 2+ bilateral femoral pulses, 2+ bilateral  dorsal pedal pulse Neuro: Alert and oriented. No facial asymmetry. No focal deficit. Moves all extremities spontaneously. Musculoskeletal: Normal muscle tone without kyphosis Psych:  Responds to questions appropriately with a normal affect.    Assessment: 76 year old male with severe chronic obstructive pulmonary disease diabetes and chronic pain with exacerbation of COPD likely driving atrial fibrillation with rapid ventricular rate now spontaneously converted to normal sinus rhythm without evidence of heart failure or angina or myocardial infarction  Plan: 1. Continue heart rate control with diltiazem but change intravenous to 180 mg of diltiazem CD 2. Anticoagulation with elequis 5 mg twice per day for further risk reduction in stroke with atrial fibrillation 3. Consideration of echocardiogram for LV systolic dysfunction right-sided enlargement and pulmonary hypertension for further assessment and treatment 4. Continue supportive care of COPD exacerbation  Signed, Lamar Blinks M.D. [facility] Card[facility] 10/24/2016, 6:28 AM

## 2016-10-25 LAB — BASIC METABOLIC PANEL
ANION GAP: 10 (ref 5–15)
BUN: 33 mg/dL — ABNORMAL HIGH (ref 6–20)
CALCIUM: 8.4 mg/dL — AB (ref 8.9–10.3)
CO2: 31 mmol/L (ref 22–32)
Chloride: 98 mmol/L — ABNORMAL LOW (ref 101–111)
Creatinine, Ser: 1.1 mg/dL (ref 0.61–1.24)
GLUCOSE: 95 mg/dL (ref 65–99)
POTASSIUM: 4.3 mmol/L (ref 3.5–5.1)
Sodium: 139 mmol/L (ref 135–145)

## 2016-10-25 LAB — URINALYSIS, COMPLETE (UACMP) WITH MICROSCOPIC
BACTERIA UA: NONE SEEN
BILIRUBIN URINE: NEGATIVE
Glucose, UA: >=500 mg/dL — AB
KETONES UR: NEGATIVE mg/dL
LEUKOCYTES UA: NEGATIVE
NITRITE: NEGATIVE
PROTEIN: 100 mg/dL — AB
SPECIFIC GRAVITY, URINE: 1.013 (ref 1.005–1.030)
SQUAMOUS EPITHELIAL / LPF: NONE SEEN
WBC UA: NONE SEEN WBC/hpf (ref 0–5)
pH: 6 (ref 5.0–8.0)

## 2016-10-25 LAB — GLUCOSE, CAPILLARY
GLUCOSE-CAPILLARY: 315 mg/dL — AB (ref 65–99)
Glucose-Capillary: 149 mg/dL — ABNORMAL HIGH (ref 65–99)
Glucose-Capillary: 239 mg/dL — ABNORMAL HIGH (ref 65–99)
Glucose-Capillary: 211 mg/dL — ABNORMAL HIGH (ref 65–99)

## 2016-10-25 LAB — CBC
HCT: 31.1 % — ABNORMAL LOW (ref 40.0–52.0)
Hemoglobin: 10.4 g/dL — ABNORMAL LOW (ref 13.0–18.0)
MCH: 29.4 pg (ref 26.0–34.0)
MCHC: 33.6 g/dL (ref 32.0–36.0)
MCV: 87.6 fL (ref 80.0–100.0)
PLATELETS: 161 10*3/uL (ref 150–440)
RBC: 3.55 MIL/uL — AB (ref 4.40–5.90)
RDW: 14.1 % (ref 11.5–14.5)
WBC: 12.1 10*3/uL — ABNORMAL HIGH (ref 3.8–10.6)

## 2016-10-25 MED ORDER — POLYETHYLENE GLYCOL 3350 17 G PO PACK
17.0000 g | PACK | Freq: Every day | ORAL | Status: DC
  Administered 2016-10-25 – 2016-10-26 (×2): 17 g via ORAL
  Filled 2016-10-25 (×2): qty 1

## 2016-10-25 MED ORDER — DOCUSATE SODIUM 100 MG PO CAPS
200.0000 mg | ORAL_CAPSULE | Freq: Two times a day (BID) | ORAL | Status: DC
  Administered 2016-10-25 – 2016-10-26 (×3): 200 mg via ORAL
  Filled 2016-10-25 (×3): qty 2

## 2016-10-25 MED ORDER — AZITHROMYCIN 250 MG PO TABS
500.0000 mg | ORAL_TABLET | Freq: Every day | ORAL | Status: DC
  Filled 2016-10-25: qty 2

## 2016-10-25 MED ORDER — METHYLPREDNISOLONE SODIUM SUCC 40 MG IJ SOLR
40.0000 mg | Freq: Three times a day (TID) | INTRAMUSCULAR | Status: DC
  Administered 2016-10-25 – 2016-10-26 (×3): 40 mg via INTRAVENOUS
  Filled 2016-10-25 (×3): qty 1

## 2016-10-25 NOTE — Progress Notes (Signed)
[facility] Encounter Note  Patient: [name] Sr. / Admit Date: 10/22/2016 / Date of Encounter: 10/25/2016, 6:48 AM   Subjective: Significant improvements in breathing cough and congestion with appropriate treatment for COPD exacerbation. Patient heart rate much more controlled at 70 bpm and spontaneously converted to normal sinus rhythm with appropriate medication management and treatment of COPD. No evidence of myocardial infarction and/or congestive heart failure  Review of Systems: Positive for: Cough congestion shortness of breath Negative for: Vision change, hearing change, syncope, dizziness, nausea, vomiting,diarrhea, bloody stool, stomach pain, positive for cough, congestion, negative for diaphoresis, urinary frequency, urinary pain,skin lesions, skin rashes Others previously listed  Objective: Telemetry: Normal sinus rhythm Physical Exam: Blood pressure (!) 143/86, pulse 85, temperature 98.2 F (36.8 C), temperature source Oral, resp. rate 18, height  (1.778 m), weight 98 kg (216 lb 0.8 oz), SpO2 97 %. Body mass index is 31 kg/m. General: Well developed, well nourished, in no acute distress. Head: Normocephalic, atraumatic, sclera non-icteric, no xanthomas, nares are without discharge. Neck: No apparent masses Lungs: Normal respirations with diffuse wheezes, some rhonchi, no rales , no crackles   Heart: Regular rate and rhythm, normal S1 S2, no murmur, no rub, no gallop, PMI is normal size and placement, carotid upstroke normal without bruit, jugular venous pressure normal Abdomen: Soft, non-tender, non-distended with normoactive bowel sounds. No hepatosplenomegaly. Abdominal aorta is normal size without bruit Extremities: Trace edema, no clubbing, no cyanosis, no ulcers,  Peripheral: 2+ radial, 2+ femoral, 2+ dorsal pedal pulses Neuro: Alert and oriented. Moves all extremities spontaneously. Psych:  Responds to questions appropriately with a  normal affect.   Intake/Output Summary (Last 24 hours) at 10/25/16 0648 Last data filed at 10/25/16 5784  Gross per 24 hour  Intake                0 ml  Output             1900 ml  Net            -1900 ml    Inpatient Medications:  . apixaban  5 mg Oral BID  . aspirin EC  81 mg Oral Daily  . budesonide (PULMICORT) nebulizer solution  0.25 mg Nebulization BID  . chlorhexidine  15 mL Mouth Rinse BID  . diltiazem  30 mg Oral Q8H  . glipiZIDE  10 mg Oral BID AC  . guaiFENesin  600 mg Oral BID  . Influenza vac split quadrivalent PF  0.5 mL Intramuscular Tomorrow-1000  . insulin aspart  0-15 Units Subcutaneous TID WC  . insulin aspart  0-5 Units Subcutaneous QHS  . ipratropium-albuterol  3 mL Nebulization Q6H  . mouth rinse  15 mL Mouth Rinse q12n4p  . metFORMIN  1,000 mg Oral BID WC  . methylPREDNISolone (SOLU-MEDROL) injection  40 mg Intravenous Q24H  . montelukast  10 mg Oral QHS  . morphine  30 mg Oral Q8H  . pantoprazole  40 mg Oral BID AC   Infusions:  . azithromycin Stopped (10/24/16 2000)    Labs:  Recent Labs  10/24/16 0449 10/25/16 0451  NA 139 139  K 4.7 4.3  CL 101 98*  CO2 31 31  GLUCOSE 215* 95  BUN 27* 33*  CREATININE 1.35* 1.10  CALCIUM 7.9* 8.4*  MG 1.6*  --     Recent Labs  10/22/16 1442 10/23/16 0330  AST 28 15  ALT 17 16*  ALKPHOS 64 60  BILITOT 0.6 0.5  PROT 7.3 6.5  ALBUMIN 3.7 3.1*    Recent Labs  10/24/16 0449 10/25/16 0451  WBC 12.3* 12.1*  HGB 9.7* 10.4*  HCT 29.5* 31.1*  MCV 87.7 87.6  PLT 155 161    Recent Labs  10/22/16 1442  TROPONINI <0.03   Invalid input(s): POCBNP  Recent Labs  10/23/16 0330  HGBA1C 7.8*     Weights: Filed Weights   10/22/16 1435 10/22/16 1715  Weight: 98 kg (216 lb) 98 kg (216 lb 0.8 oz)     Radiology/Studies:  Dg Chest Portable 1 View  Result Date: 10/22/2016 CLINICAL DATA:  Shortness of breath. EXAM: PORTABLE CHEST 1 VIEW COMPARISON:  Radiographs of July 05, 2016. FINDINGS:  The heart size and mediastinal contours are within normal limits. Both lungs are clear. No pneumothorax or pleural effusion is noted. Atherosclerosis of thoracic aorta is noted. The visualized skeletal structures are unremarkable. IMPRESSION: No acute cardiopulmonary abnormality seen.  Aortic atherosclerosis. Electronically Signed   By: Lupita Raider, M.D.   On: 10/22/2016 15:19     Assessment and Recommendation  76 y.o. male with known significant chronic obstructive pulmonary disease diabetes with complication having exacerbation of COPD and new onset of atrial fibrillation with rapid ventricular rate now spontaneously converted to normal sinus rhythm and significantly improved without evidence of myocardial infarction and or congestive heart failure 1. Continue diltiazem for control of heart rate and maintenance of normal sinus rhythm 2. Anticoagulation for further risk reduction in stroke with atrial fibrillation 3. Continue supportive care of exacerbation of COPD 4. No further intervention from cardiac standpoint without evidence of myocardial infarction or congestive heart failure 5. Begin ambulation and follow for improvements of symptoms and maintenance of normal sinus rhythm with possible discharge to home and follow-up next week for further adjustments of medications as necessary  Signed, Arnoldo Hooker M.D. FACC

## 2016-10-25 NOTE — Progress Notes (Signed)
PHARMACIST - PHYSICIAN COMMUNICATION DR:   Allena Katz CONCERNING: Antibiotic IV to Oral Route Change Policy  RECOMMENDATION: This patient is receiving azithromycin by the intravenous route.  Based on criteria approved by the Pharmacy and Therapeutics Committee, the antibiotic(s) is/are being converted to the equivalent oral dose form(s).   DESCRIPTION: These criteria include:  Patient being treated for a respiratory tract infection, urinary tract infection, cellulitis or clostridium difficile associated diarrhea if on metronidazole  The patient is not neutropenic and does not exhibit a GI malabsorption state  The patient is eating (either orally or via tube) and/or has been taking other orally administered medications for a least 24 hours  The patient is improving clinically and has a Tmax < 100.5  If you have questions about this conversion, please contact the Pharmacy Department   Cindi Carbon, PharmD 10/25/16 9:27 AM

## 2016-10-25 NOTE — Progress Notes (Signed)
Dr. Anne Hahn returned paged no new orders at this time, just continue to monitor pt.

## 2016-10-25 NOTE — Progress Notes (Signed)
[facility] - [place] at [facility]                                                                                                                                                                                  Patient Demographics   Jeremy Wells, is a 76 y.o. male, DOB - Jul 13, 1940, ZOX:[id number]  Admit date - 10/22/2016   Admitting Physician Ramonita Lab, MD  Outpatient Primary MD for the patient is Danella Penton, MD   LOS - 3  Subjective: Patient continues to be short of breath and wheezing  Review of Systems:   CONSTITUTIONAL: No documented fever. No fatigue, weakness. No weight gain, no weight loss.  EYES: No blurry or double vision.  ENT: No tinnitus. No postnasal drip. No redness of the oropharynx.  RESPIRATORY: Positive cough, positive wheeze, no hemoptysis. No dyspnea.  CARDIOVASCULAR: No chest pain. No orthopnea. No palpitations. No syncope.  GASTROINTESTINAL: No nausea, no vomiting or diarrhea. No abdominal pain. No melena or hematochezia.  GENITOURINARY: No dysuria or hematuria.  ENDOCRINE: No polyuria or nocturia. No heat or cold intolerance.  HEMATOLOGY: No anemia. No bruising. No bleeding.  INTEGUMENTARY: No rashes. No lesions.  MUSCULOSKELETAL: No arthritis. No swelling. No gout.  NEUROLOGIC: No numbness, tingling, or ataxia. No seizure-type activity.  PSYCHIATRIC: No anxiety. No insomnia. No ADD.    Vitals:   Vitals:   10/24/16 2057 10/25/16 0338 10/25/16 0800 10/25/16 1135  BP: (!) 146/65 (!) 143/86 (!) 120/59 (!) 139/56  Pulse: 85 85 84 83  Resp: Temp: 98.6 F (37 C) 98.2 F (36.8 C) 98.8 F (37.1 C) 98.6 F (37 C)  TempSrc: Oral Oral Oral   SpO2: 92% 97% 98% 95%  Weight:      Height:        Wt Readings from Last 3 Encounters:  10/22/16 216 lb 0.8 oz (98 kg)  07/05/16 206 lb (93.4 kg)  12/31/15 209 lb 1.6 oz (94.8 kg)     Intake/Output Summary (Last 24 hours) at 10/25/16 1410 Last data filed at 10/25/16  1352  Gross per 24 hour  Intake              480 ml  Output             1925 ml  Net            -1445 ml    Physical Exam:   GENERAL: Pleasant-appearing in no apparent distress.  HEAD, EYES, EARS, NOSE AND THROAT: Atraumatic, normocephalic. Extraocular muscles are intact. Pupils equal and reactive to light. Sclerae anicteric. No conjunctival injection. No oro-pharyngeal erythema.  NECK: Supple. There is  no jugular venous distention. No bruits, no lymphadenopathy, no thyromegaly.  HEART: irregularly irregular,. No murmurs, no rubs, no clicks.  LUNGS: Occasional wheezing throughout both lungs with diminished air entry ABDOMEN: Soft, flat, nontender, nondistended. Has good bowel sounds. No hepatosplenomegaly appreciated.  EXTREMITIES: No evidence of any cyanosis, clubbing, or peripheral edema.  +2 pedal and radial pulses bilaterally.  NEUROLOGIC: The patient is alert, awake, and oriented x3 with no focal motor or sensory deficits appreciated bilaterally.  SKIN: Moist and warm with no rashes appreciated.  Psych: Not anxious, depressed LN: No inguinal LN enlargement    Antibiotics   Anti-infectives    Start     Dose/Rate Route Frequency Ordered Stop   10/25/16 1800  azithromycin (ZITHROMAX) tablet 500 mg     500 mg Oral Daily-1800 10/25/16 0926     10/22/16 1800  azithromycin (ZITHROMAX) 500 mg in dextrose 5 % 250 mL IVPB  Status:  Discontinued     500 mg 250 mL/hr over 60 Minutes Intravenous Every 24 hours 10/22/16 1702 10/25/16 0926      Medications   Scheduled Meds: . apixaban  5 mg Oral BID  . aspirin EC  81 mg Oral Daily  . azithromycin  500 mg Oral q1800  . budesonide (PULMICORT) nebulizer solution  0.25 mg Nebulization BID  . chlorhexidine  15 mL Mouth Rinse BID  . diltiazem  30 mg Oral Q8H  . docusate sodium  200 mg Oral BID  . glipiZIDE  10 mg Oral BID AC  . guaiFENesin  600 mg Oral BID  . Influenza vac split quadrivalent PF  0.5 mL Intramuscular Tomorrow-1000  .  insulin aspart  0-15 Units Subcutaneous TID WC  . insulin aspart  0-5 Units Subcutaneous QHS  . ipratropium-albuterol  3 mL Nebulization Q6H  . mouth rinse  15 mL Mouth Rinse q12n4p  . metFORMIN  1,000 mg Oral BID WC  . methylPREDNISolone (SOLU-MEDROL) injection  40 mg Intravenous Q8H  . montelukast  10 mg Oral QHS  . morphine  30 mg Oral Q8H  . pantoprazole  40 mg Oral BID AC  . polyethylene glycol  17 g Oral Daily   Continuous Infusions:  PRN Meds:.acetaminophen **OR** acetaminophen, albuterol, ondansetron **OR** ondansetron (ZOFRAN) IV   Data Review:   Micro Results Recent Results (from the past 240 hour(s))  MRSA PCR Screening     Status: None   Collection Time: 10/22/16  5:13 PM  Result Value Ref Range Status   MRSA by PCR NEGATIVE NEGATIVE Final    Comment:        The GeneXpert MRSA Assay (FDA approved for NASAL specimens only), is one component of a comprehensive MRSA colonization surveillance program. It is not intended to diagnose MRSA infection nor to guide or monitor treatment for MRSA infections.     Radiology Reports Dg Chest Portable 1 View  Result Date: 10/22/2016 CLINICAL DATA:  Shortness of breath. EXAM: PORTABLE CHEST 1 VIEW COMPARISON:  Radiographs of July 05, 2016. FINDINGS: The heart size and mediastinal contours are within normal limits. Both lungs are clear. No pneumothorax or pleural effusion is noted. Atherosclerosis of thoracic aorta is noted. The visualized skeletal structures are unremarkable. IMPRESSION: No acute cardiopulmonary abnormality seen.  Aortic atherosclerosis. Electronically Signed   By: Lupita Raider, M.D.   On: 10/22/2016 15:19     CBC  Recent Labs Lab 10/22/16 1442 10/23/16 0330 10/24/16 0449 10/25/16 0451  WBC 8.0 5.0 12.3* 12.1*  HGB 10.9* 10.2* 9.7*  10.4*  HCT 32.8* 30.6* 29.5* 31.1*  PLT 153 134* 155 161  MCV 88.1 88.2 87.7 87.6  MCH 29.3 29.6 28.9 29.4  MCHC 33.3 33.5 33.0 33.6  RDW 14.5 14.1 14.1 14.1     Chemistries   Recent Labs Lab 10/22/16 1442 10/23/16 0330 10/24/16 0449 10/25/16 0451  NA 139 139 139 139  K 4.3 4.8 4.7 4.3  CL 99* 100* 101 98*  CO2 GLUCOSE 207* 285* 215* 95  BUN 13 16 27* 33*  CREATININE 1.27* 1.17 1.35* 1.10  CALCIUM 8.3* 8.0* 7.9* 8.4*  MG  --   --  1.6*  --   AST 28 15  --   --   ALT 17 16*  --   --   ALKPHOS 64 60  --   --   BILITOT 0.6 0.5  --   --    ------------------------------------------------------------------------------------------------------------------ estimated creatinine clearance is 68.1 mL/min (by C-G formula based on SCr of 1.1 mg/dL). ------------------------------------------------------------------------------------------------------------------  Recent Labs  10/23/16 0330  HGBA1C 7.8*   ------------------------------------------------------------------------------------------------------------------ No results for input(s): CHOL, HDL, LDLCALC, TRIG, CHOLHDL, LDLDIRECT in the last 72 hours. ------------------------------------------------------------------------------------------------------------------ No results for input(s): TSH, T4TOTAL, T3FREE, THYROIDAB in the last 72 hours.  Invalid input(s): FREET3 ------------------------------------------------------------------------------------------------------------------ No results for input(s): VITAMINB12, FOLATE, FERRITIN, TIBC, IRON, RETICCTPCT in the last 72 hours.  Coagulation profile No results for input(s): INR, PROTIME in the last 168 hours.  No results for input(s): DDIMER in the last 72 hours.  Cardiac Enzymes  Recent Labs Lab 10/22/16 1442  TROPONINI <0.03   ------------------------------------------------------------------------------------------------------------------ Invalid input(s): POCBNP    Assessment & Plan   Jeremy Wells  is a 76 y.o. 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 Patient was severely short of breath even after 3 nebulizer treatments, Solu-Medrol and magnesium. Patient is placed on BiPAP   #Acute respiratory failure severity to COPD exacerbation Patient's breathing is improved Continue therapy with nebulizers I will change IV Solu-Medrol to IV every 6  #afib- continue oral Cardizem  #Diabetes mellitus Continue sliding scale insulin continue Glucotrol and metformin Sugar elevated due to steroid  #Acute kidney injury Stable continue to monitor  #Chronic pain continue his home regimen  GI prophylaxis with Protonix and DVT prophylaxis with Lovenox subcutaneous      Code Status Orders        Start     Ordered   10/22/16 1718  Full code  Continuous     10/22/16 1717    Code Status History    Date Active Date Inactive Code Status Order ID Comments User Context   07/05/2016  6:19 PM 07/07/2016  3:06 PM Full Code [id number]  Enid Baas, MD Inpatient    Advance Directive Documentation     Most Recent Value  Type of Advance Directive  Healthcare Power of Attorney, Living will  Pre-existing out of facility DNR order (yellow form or pink MOST form)  -  "MOST" Form in Place?  -           Consults  Pulmonary critical care   DVT Prophylaxis  Lovenox  Lab Results  Component Value Date   PLT 161 10/25/2016     Time Spent in minutes    Greater than 50% of time spent in care coordination and counseling  patient regarding the condition and plan of care.   Auburn Bilberry M.D on 10/25/2016 at 2:10 PM  Between 7am to 6pm - Pager - [phone]  After 6pm go to [email or web address] - password [email or web address] [facility]  [facility] [place] [facility]   Office  [phone]

## 2016-10-26 LAB — GLUCOSE, CAPILLARY
GLUCOSE-CAPILLARY: 223 mg/dL — AB (ref 65–99)
GLUCOSE-CAPILLARY: 229 mg/dL — AB (ref 65–99)

## 2016-10-26 MED ORDER — DILTIAZEM HCL ER COATED BEADS 120 MG PO CP24: 120.0000 mg | ORAL_CAPSULE | Freq: Every day | ORAL | 0 refills | Status: AC

## 2016-10-26 MED ORDER — IPRATROPIUM-ALBUTEROL 0.5-2.5 (3) MG/3ML IN SOLN: 3.0000 mL | Freq: Four times a day (QID) | RESPIRATORY_TRACT | 2 refills | Status: AC

## 2016-10-26 MED ORDER — TIOTROPIUM BROMIDE MONOHYDRATE 18 MCG IN CAPS: 18.0000 ug | ORAL_CAPSULE | Freq: Every day | RESPIRATORY_TRACT | 12 refills | Status: AC

## 2016-10-26 MED ORDER — AZITHROMYCIN 250 MG PO TABS: ORAL_TABLET | ORAL | 0 refills | Status: DC

## 2016-10-26 MED ORDER — PREDNISONE 10 MG (21) PO TBPK: ORAL_TABLET | ORAL | 0 refills | Status: AC

## 2016-10-26 MED ORDER — GUAIFENESIN ER 600 MG PO TB12: 600.0000 mg | ORAL_TABLET | Freq: Two times a day (BID) | ORAL | 0 refills | Status: AC

## 2016-10-26 MED ORDER — APIXABAN 5 MG PO TABS: 5.0000 mg | ORAL_TABLET | Freq: Two times a day (BID) | ORAL | 1 refills | Status: AC

## 2016-10-26 NOTE — Progress Notes (Signed)
Pt. Slept well throughout the night with no c/o of pain, SOB or acute distress noted.

## 2016-10-26 NOTE — Progress Notes (Signed)
Pt discharged to home with wife. Discharge instructions reviewed, IV & telemetry monitor removed. Pt escorted to car on 4 L O2 and then switched to his personal O2 supply that was in his vehicle.

## 2016-10-26 NOTE — Care Management (Signed)
CM consult present for new Eliquis.  Patient was discharged before CM could speak with patient. Primary nurse states there were no CM needs but the consult was for new Eliquis.  Spoke with Ms Kulpa over the phone and informed patient uses [facility] pharmacy.  Called with ID numbers for 30 day trial:  BIN [id number]  PCN [id number] GRP  [id number]  ID [phone] EXP 02/01/2018.  Provided pharmacist with CM contact information in the event there are issues with the coupon.  There is no activation required.  Patient on chronic home oxygen.

## 2016-10-26 NOTE — Care Management Important Message (Signed)
Important Message  Patient Details  Name: Jeremy BLUCHER Sr. MRN: [id number] Date of Birth: [birth year]   Medicare Important Message Given:  No Patient was discharged before CM could provided notice.     Eber Hong, RN 10/26/2016, 2:39 PM

## 2016-10-26 NOTE — Discharge Summary (Signed)
[facility] - [place] at [facility] Sr., 76 y.o., DOB February 11, 1940, MRN [id number]. Admission date: 10/22/2016 Discharge Date 10/26/2016 Primary MD Danella Penton, MD Admitting Physician Ramonita Lab, MD  Admission Diagnosis  COPD exacerbation [facility]) [J44.1]  Discharge Diagnosis   Active Problems:   Acute respiratory failure (HCC) Acute on chronic COPD exasperation A. fib with RVR Diabetes type 2 Acute kidney injury Chronic pain      Hospital Course  Jeremy Wells a 76 y.o. malewith a known history of Chronic history of COPD and lives on 4 L of oxygen, diabetes mellitus, chronic pain syndrome is presenting to the ED with a chief complaint of worsening of shortness of breath. Patient was severely short of breath even after 3 nebulizer treatments, Solu-Medrol and magnesium. Patient is placed on BiPAP. Patient symptoms were slow to improve. However his breathing has gotten better. He is back on his 4 L of oxygen. He was treated for acute on chronic COPD exasperation with nebulizers steroids and antibiotics. Also during hospitalization he was noted to have A. fib with RVR which was treated with Cardizem drip and subsequently oral medications. Patient was seen by cardiology and started on eliquis.  He will need to follow-up with pulmonary to have pulmonary function test She also has issues with likely circulation is outpatient vascular follow-up.          Consults  intensivist and cardiology  Significant Tests:  See full reports for all details     Dg Chest Portable 1 View  Result Date: 10/22/2016 CLINICAL DATA:  Shortness of breath. EXAM: PORTABLE CHEST 1 VIEW COMPARISON:  Radiographs of July 05, 2016. FINDINGS: The heart size and mediastinal contours are within normal limits. Both lungs are clear. No pneumothorax or pleural effusion is noted. Atherosclerosis of thoracic aorta is noted. The visualized skeletal structures are unremarkable.  IMPRESSION: No acute cardiopulmonary abnormality seen.  Aortic atherosclerosis. Electronically Signed   By: Lupita Raider, M.D.   On: 10/22/2016 15:19       Today   Subjective:   Jeremy Wells  patient's breathing is improved. He states he is ready to go home  Objective:   Blood pressure (!) 149/67, pulse 82, temperature 97.7 F (36.5 C), temperature source Oral, resp. rate 20, height  (1.778 m), weight 216 lb 0.8 oz (98 kg), SpO2 98 %.  .  Intake/Output Summary (Last 24 hours) at 10/26/16 1507 Last data filed at 10/26/16 1000  Gross per 24 hour  Intake              840 ml  Output             1650 ml  Net             -810 ml    Exam VITAL SIGNS: Blood pressure (!) 149/67, pulse 82, temperature 97.7 F (36.5 C), temperature source Oral, resp. rate 20, height  (1.778 m), weight 216 lb 0.8 oz (98 kg), SpO2 98 %.  GENERAL:  76 y.o.-year-old patient lying in the bed with no acute distress.  EYES: Pupils equal, round, reactive to light and accommodation. No scleral icterus. Extraocular muscles intact.  HEENT: Head atraumatic, normocephalic. Oropharynx and nasopharynx clear.  NECK:  Supple, no jugular venous distention. No thyroid enlargement, no tenderness.  LUNGS: Normal breath sounds bilaterally, no wheezing, rales,rhonchi or crepitation. No use of accessory muscles of respiration.  CARDIOVASCULAR: S1, S2 normal. No murmurs, rubs, or gallops.  ABDOMEN:  Soft, nontender, nondistended. Bowel sounds present. No organomegaly or mass.  EXTREMITIES: No pedal edema, cyanosis, or clubbing.  NEUROLOGIC: Cranial nerves II through XII are intact. Muscle strength 5/5 in all extremities. Sensation intact. Gait not checked.  PSYCHIATRIC: The patient is alert and oriented x 3.  SKIN: No obvious rash, lesion, or ulcer.   Data Review     CBC w Diff: Lab Results  Component Value Date   WBC 12.1 (H) 10/25/2016   HGB 10.4 (L) 10/25/2016   HGB 10.1 (L) 09/22/2013   HCT 31.1 (L)  10/25/2016   HCT 33.2 (L) 09/22/2013   PLT 161 10/25/2016   PLT 153 09/22/2013   CMP: Lab Results  Component Value Date   NA 139 10/25/2016   NA 139 09/22/2013   K 4.3 10/25/2016   K 4.5 09/22/2013   CL 98 (L) 10/25/2016   CL 99 09/22/2013   CO2 31 10/25/2016   CO2 37 (H) 09/22/2013   BUN 33 (H) 10/25/2016   BUN 14 09/22/2013   CREATININE 1.10 10/25/2016   CREATININE 1.21 09/22/2013   PROT 6.5 10/23/2016   PROT 7.3 09/22/2013   ALBUMIN 3.1 (L) 10/23/2016   ALBUMIN 3.2 (L) 09/22/2013   BILITOT 0.5 10/23/2016   BILITOT 0.3 09/22/2013   ALKPHOS 60 10/23/2016   ALKPHOS 81 09/22/2013   AST 15 10/23/2016   AST 19 09/22/2013   ALT 16 (L) 10/23/2016   ALT 24 09/22/2013  .  Micro Results Recent Results (from the past 240 hour(s))  MRSA PCR Screening     Status: None   Collection Time: 10/22/16  5:13 PM  Result Value Ref Range Status   MRSA by PCR NEGATIVE NEGATIVE Final    Comment:        The GeneXpert MRSA Assay (FDA approved for NASAL specimens only), is one component of a comprehensive MRSA colonization surveillance program. It is not intended to diagnose MRSA infection nor to guide or monitor treatment for MRSA infections.         Code Status Orders        Start     Ordered   10/22/16 1718  Full code  Continuous     10/22/16 1717    Code Status History    Date Active Date Inactive Code Status Order ID Comments User Context   07/05/2016  6:19 PM 07/07/2016  3:06 PM Full Code [id number]  Enid Baas, MD Inpatient    Advance Directive Documentation     Most Recent Value  Type of Advance Directive  Healthcare Power of Attorney, Living will  Pre-existing out of facility DNR order (yellow form or pink MOST form)  -  "MOST" Form in Place?  -          Follow-up Information    Danella Penton, MD Follow up in 1 week(s).   Specialty:  Internal Medicine Why:  hospital f/u Contact information: [address] [facility]  [place] [address] [phone]        Merwyn Katos, MD. Go on 11/02/2016.   Specialty:  Pulmonary Disease Why:  :15PM - as new pt with h/o copd Contact information: [address] [place] [address] [phone]        Annice Needy, MD In 3 weeks.   Specialties:  Vascular Surgery, Radiology, Interventional Cardiology Why:  eval for PVD Contact information: [address] [place] [address] [phone]  Discharge Medications   Allergies as of 10/26/2016      Reactions   Ambien [zolpidem] Nausea And Vomiting   Codeine Nausea And Vomiting      Medication List    STOP taking these medications   aspirin EC 81 MG tablet   predniSONE 10 MG tablet Commonly known as:  DELTASONE Replaced by:  predniSONE 10 MG (21) Tbpk tablet     TAKE these medications   ADVAIR DISKUS 250-50 MCG/DOSE Aepb Generic drug:  Fluticasone-Salmeterol Inhale 1 puff into the lungs 2 (two) times daily.   albuterol 108 (90 Base) MCG/ACT inhaler Commonly known as:  PROVENTIL HFA;VENTOLIN HFA Inhale 1-2 puffs into the lungs every 4 (four) hours as needed for wheezing or shortness of breath. What changed:  Another medication with the same name was removed. Continue taking this medication, and follow the directions you see here.   apixaban 5 MG Tabs tablet Commonly known as:  ELIQUIS Take 1 tablet (5 mg total) by mouth 2 (two) times daily.   azithromycin 250 MG tablet Commonly known as:  ZITHROMAX One tab daily x 3 days   diltiazem 120 MG 24 hr capsule Commonly known as:  CARDIZEM CD Take 1 capsule (120 mg total) by mouth daily.   glipiZIDE 10 MG tablet Commonly known as:  GLUCOTROL Take 10 mg by mouth 2 (two) times daily before a meal.   guaiFENesin 600 MG 12 hr tablet Commonly known as:  MUCINEX Take 1 tablet (600 mg total) by mouth 2 (two) times daily.   ipratropium-albuterol 0.5-2.5 (3) MG/3ML Soln Commonly known as:  DUONEB Take 3 mLs  by nebulization every 6 (six) hours.   metFORMIN 1000 MG tablet Commonly known as:  GLUCOPHAGE Take 1,000 mg by mouth 2 (two) times daily with a meal.   montelukast 10 MG tablet Commonly known as:  SINGULAIR Take 10 mg by mouth at bedtime.   morphine 30 MG tablet Commonly known as:  MSIR Take 30 mg by mouth 3 (three) times daily.   nicotine 14 mg/24hr patch Commonly known as:  NICODERM CQ - dosed in mg/24 hours Place 1 patch (14 mg total) onto the skin daily.   omeprazole 20 MG capsule Commonly known as:  PRILOSEC Take 20 mg by mouth daily.   predniSONE 10 MG (21) Tbpk tablet Commonly known as:  STERAPRED UNI-PAK 21 TAB Start at  taper by  until complete Replaces:  predniSONE 10 MG tablet   tiotropium 18 MCG inhalation capsule Commonly known as:  SPIRIVA HANDIHALER Place 1 capsule (18 mcg total) into inhaler and inhale daily.            Discharge Care Instructions        Start     Ordered   10/26/16 0000  ipratropium-albuterol (DUONEB) 0.5-2.5 (3) MG/3ML SOLN  Every 6 hours     10/26/16 0912   10/26/16 0000  guaiFENesin (MUCINEX) 600 MG 12 hr tablet  2 times daily     10/26/16 0912   10/26/16 0000  azithromycin (ZITHROMAX) 250 MG tablet     10/26/16 0912   10/26/16 0000  tiotropium (SPIRIVA HANDIHALER) 18 MCG inhalation capsule  Daily     10/26/16 0912   10/26/16 0000  predniSONE (STERAPRED UNI-PAK 21 TAB) 10 MG (21) TBPK tablet     10/26/16 0912   10/26/16 0000  diltiazem (CARDIZEM CD) 120 MG 24 hr capsule  Daily     10/26/16 0912   10/26/16 0000  apixaban (ELIQUIS) 5  MG TABS tablet  2 times daily     10/26/16 0912         Total Time in preparing paper work, data evaluation and todays exam - 35 minutes  Auburn Bilberry M.D on 10/26/2016 at 3:07 [facility]  [facility]   Office  [phone]

## 2016-11-02 ENCOUNTER — Inpatient Hospital Stay: Payer: Medicare Other | Admitting: Internal Medicine

## 2016-11-02 NOTE — Progress Notes (Deleted)
[facility]     Assessment and Plan:  The patient is a 76 year old male smoker with a history of with a history of severe end-stage COPD.   Date: 11/02/2016  MRN# [id number] ONESIMO LINGARD Sr. 01-Nov-1940   Iran Planas Sr. is a 76 y.o. old male seen in follow up for chief complaint of  No chief complaint on file.    HPI:   The patient is a 76 year old male with severe/end-stage COPD. Patient is seen by Dr. Bethann Punches here at [facility] clinic, she has been followed by pulmonologist, Dr. Cherly Hensen at [facility]. She was recently discharged on 10/26/16 after a 4 day admission for acute exacerbation of COPD. She was seen by our service at that time, and she expressed that she would like to be seen by somebody closer to home. Review of her chart shows that the patient has been maintained on Advair, Spiriva, as a throat tumor and 50 mg daily, Daliresp 500 mg daily, Singulair. At her last visit with her pulmonologist. She was referred to pulmonary rehabilitation, advised to undergo sleep study, and advised to quit smoking.  Images personally reviewed, consistent with hyperinflation, COPD/emphysema. Review of VBG c/w hypercapnic resp failure.   Spirometry 01/24/16 FEV1 POST 0.79 (L) 2.08271 - 3.57377 L [facility]  FEV1/FVC POST 33.81 (L) 62.893 - 82.249 % [facility]  FVC POST 2.33 (L) 4.09811 - 4.80295 L [facility]  PEF POST 1.76 (L) 5.21615 - 9.60195 L/s [facility]  VOL extrap post 0.01 L [facility]  FIVC POST 0.02 (L) 9.14782 - 4.80295 L [facility]  FEV6 POST 1.81 (L) 2.81034 - 4.52528 L [facility]  FEV1/FEV6 POST 43.55 (L) 68.007 - 85.943 % [facility]  FEF50% POST 0.27 L/s [facility]  FEF25-75% POST 0.24 (L) 0.5388 - 3.56222 L/s [facility]  NFAOZH08-65 POST 0.35 L/s [facility]  FET100% POST 20.67 sec [facility]  FEV1 PRE 0.76 (L) 2.08271 - 3.57377 L [facility]  FEV1/FVC PRE 33.51 (L) 62.893 - 82.249 % [facility]  FVC PRE 2.28 (L) 7.84696 - 4.80295 L [facility]  PEF PRE 1.49 (L) 5.21615 -  9.60195 L/s [facility]  Vol extrap pre 0.03 L [facility]  FIVC PRE 0.61 (L) 3.03893 - 4.80295 L [facility]  PIF PRE 0.89 (L) 3.3722 - 3.3722 L/s [facility]  FIF50% PRED 0.52 (L) 2.2244 - 6.4356 L/s [facility]  FEV6 PRE 1.84 (L) 2.81034 - 4.52528 L [facility]  FEV1/FEV6 PRE 41.58 (L) 68.007 - 85.943 % [facility]  FEF50% PRE 0.28 L/s [facility]  FEF25-75% PRE 0.26 (L) 0.5388 - 3.56222 L/s [facility]  EXB/MWU13 pre 54.7 % [facility]  ISOFEF25-75 PRE 0.26 L/s [facility]  FET100% Change       Medication:    Current Outpatient Prescriptions:  .  albuterol (PROVENTIL HFA;VENTOLIN HFA) 108 (90 Base) MCG/ACT inhaler, Inhale 1-2 puffs into the lungs every 4 (four) hours as needed for wheezing or shortness of breath., Disp: , Rfl:  .  apixaban (ELIQUIS) 5 MG TABS tablet, Take 1 tablet (5 mg total) by mouth 2 (two) times daily., Disp: 60 tablet, Rfl: 1 .  azithromycin (ZITHROMAX) 250 MG tablet, One tab daily x 3 days, Disp: 3 each, Rfl: 0 .  diltiazem (CARDIZEM CD) 120 MG 24 hr capsule, Take 1 capsule (120 mg total) by mouth daily., Disp: 30 capsule, Rfl: 0 .  Fluticasone-Salmeterol (ADVAIR DISKUS) 250-50 MCG/DOSE AEPB, Inhale 1 puff  into the lungs 2 (two) times daily., Disp: , Rfl:  .  glipiZIDE (GLUCOTROL) 10 MG tablet, Take 10 mg by mouth 2 (two) times daily before a meal., Disp: , Rfl:  .  guaiFENesin (MUCINEX) 600 MG 12 hr tablet, Take 1 tablet (600 mg total) by mouth 2 (two) times daily., Disp: 14 tablet, Rfl: 0 .  ipratropium-albuterol (DUONEB) 0.5-2.5 (3) MG/3ML SOLN, Take 3 mLs by nebulization every 6 (six) hours., Disp: 360 mL, Rfl: 2 .  metFORMIN (GLUCOPHAGE) 1000 MG tablet, Take 1,000 mg by mouth 2 (two) times daily with a meal., Disp: , Rfl:  .  montelukast (SINGULAIR) 10 MG tablet, Take 10 mg by mouth at bedtime., Disp: , Rfl:  .  morphine (MSIR) 30 MG tablet, Take 30 mg by mouth 3 (three) times daily., Disp: , Rfl:  .  nicotine (NICODERM CQ - DOSED IN MG/24 HOURS) 14 mg/24hr patch, Place 1 patch (14 mg total) onto the  skin daily. (Patient not taking: Reported on 10/22/2016), Disp: 21 patch, Rfl: 0 .  omeprazole (PRILOSEC) 20 MG capsule, Take 20 mg by mouth daily., Disp: , Rfl:  .  predniSONE (STERAPRED UNI-PAK 21 TAB) 10 MG (21) TBPK tablet, Start at  taper by  until complete, Disp: 21 tablet, Rfl: 0 .  tiotropium (SPIRIVA HANDIHALER) 18 MCG inhalation capsule, Place 1 capsule (18 mcg total) into inhaler and inhale daily., Disp: 30 capsule, Rfl: 12   Allergies:  Ambien [zolpidem] and Codeine  Review of Systems: Gen:  Denies  fever, sweats. HEENT: Denies blurred vision. Cvc:  No dizziness, chest pain or heaviness Resp:   Denies cough or sputum porduction. Gi: Denies swallowing difficulty, stomach pain. constipation, bowel incontinence Gu:  Denies bladder incontinence, burning urine Ext:   No Joint pain, stiffness. Skin: No skin rash, easy bruising. Endoc:  No polyuria, polydipsia. Psych: No depression, insomnia. Other:  All other systems were reviewed and found to be negative other than what is mentioned in the HPI.   Physical Examination:   VS: There were no vitals taken for this visit.  {PHYSICAL EXAM WITH PROVIDER CHOICES:22563} General Appearance: No distress  Neuro:without focal findings,  speech normal,  HEENT: PERRLA, EOM intact. Pulmonary: normal breath sounds, No wheezing.   CardiovascularNormal S1,S2.  No m/r/g.   Abdomen: Benign, Soft, non-tender. Renal:  No costovertebral tenderness  GU:  Not performed at this time. Endoc: No evident thyromegaly, no signs of acromegaly. Skin:   warm, no rash. Extremities: normal, no cyanosis, clubbing.   LABORATORY PANEL:   CBC No results for input(s): WBC, HGB, HCT, PLT in the last 168 hours. ------------------------------------------------------------------------------------------------------------------  Chemistries  No results for input(s): NA, K, CL, CO2, GLUCOSE, BUN, CREATININE, CALCIUM, MG, AST, ALT, ALKPHOS, BILITOT in the  last 168 hours.  Invalid input(s): GFRCGP ------------------------------------------------------------------------------------------------------------------  Cardiac Enzymes No results for input(s): TROPONINI in the last 168 hours. ------------------------------------------------------------  RADIOLOGY:   No results found for this or any previous visit. Results for orders placed during the hospital encounter of 07/05/16  DG Chest 2 View   Narrative CLINICAL DATA:  Patient with shortness of breath.  Wheezing.  EXAM: CHEST  2 VIEW  COMPARISON:  Chest radiograph 09/22/2013  FINDINGS: Stable cardiac and mediastinal contours. No consolidative pulmonary opacities. No pleural effusion or pneumothorax. Thoracic spine degenerative changes. Pulmonary hyperinflation.  IMPRESSION: Pulmonary hyperinflation.  No acute cardiopulmonary process.  Cardiomegaly.   Electronically Signed   By: Annia Belt M.D.   On: 07/05/2016 14:24    ------------------------------------------------------------------------------------------------------------------  Thank  you for allowing [facility] [place] to assist in the care of your patient. Our recommendations are noted above.  Please contact us if we can be of further service.   Wells Guiles, MD.  [facility] Office Number: [phone]  Santiago Glad, M.D.  Billy Fischer, M.D  11/02/2016

## 2016-11-17 ENCOUNTER — Encounter (INDEPENDENT_AMBULATORY_CARE_PROVIDER_SITE_OTHER): Payer: Self-pay | Admitting: Vascular Surgery

## 2017-02-01 ENCOUNTER — Inpatient Hospital Stay
Admission: EM | Admit: 2017-02-01 | Discharge: 2017-03-05 | DRG: 190 | Disposition: E | Payer: Medicare Other | Attending: Internal Medicine | Admitting: Internal Medicine

## 2017-02-01 ENCOUNTER — Encounter: Payer: Self-pay | Admitting: Emergency Medicine

## 2017-02-01 ENCOUNTER — Emergency Department: Payer: Medicare Other

## 2017-02-01 ENCOUNTER — Other Ambulatory Visit: Payer: Self-pay

## 2017-02-01 DIAGNOSIS — Z66 Do not resuscitate: Secondary | ICD-10-CM | POA: Diagnosis not present

## 2017-02-01 DIAGNOSIS — F17210 Nicotine dependence, cigarettes, uncomplicated: Secondary | ICD-10-CM | POA: Diagnosis present

## 2017-02-01 DIAGNOSIS — J9622 Acute and chronic respiratory failure with hypercapnia: Secondary | ICD-10-CM | POA: Diagnosis present

## 2017-02-01 DIAGNOSIS — G894 Chronic pain syndrome: Secondary | ICD-10-CM | POA: Diagnosis present

## 2017-02-01 DIAGNOSIS — Z7901 Long term (current) use of anticoagulants: Secondary | ICD-10-CM

## 2017-02-01 DIAGNOSIS — Z79899 Other long term (current) drug therapy: Secondary | ICD-10-CM | POA: Diagnosis not present

## 2017-02-01 DIAGNOSIS — Z7984 Long term (current) use of oral hypoglycemic drugs: Secondary | ICD-10-CM

## 2017-02-01 DIAGNOSIS — F40240 Claustrophobia: Secondary | ICD-10-CM | POA: Diagnosis present

## 2017-02-01 DIAGNOSIS — J9621 Acute and chronic respiratory failure with hypoxia: Secondary | ICD-10-CM | POA: Diagnosis present

## 2017-02-01 DIAGNOSIS — I248 Other forms of acute ischemic heart disease: Secondary | ICD-10-CM | POA: Diagnosis present

## 2017-02-01 DIAGNOSIS — Z885 Allergy status to narcotic agent status: Secondary | ICD-10-CM

## 2017-02-01 DIAGNOSIS — E875 Hyperkalemia: Secondary | ICD-10-CM | POA: Diagnosis present

## 2017-02-01 DIAGNOSIS — E669 Obesity, unspecified: Secondary | ICD-10-CM | POA: Diagnosis present

## 2017-02-01 DIAGNOSIS — J439 Emphysema, unspecified: Secondary | ICD-10-CM | POA: Diagnosis present

## 2017-02-01 DIAGNOSIS — J449 Chronic obstructive pulmonary disease, unspecified: Secondary | ICD-10-CM | POA: Diagnosis present

## 2017-02-01 DIAGNOSIS — Z823 Family history of stroke: Secondary | ICD-10-CM

## 2017-02-01 DIAGNOSIS — G2581 Restless legs syndrome: Secondary | ICD-10-CM | POA: Diagnosis present

## 2017-02-01 DIAGNOSIS — Z6826 Body mass index (BMI) 26.0-26.9, adult: Secondary | ICD-10-CM

## 2017-02-01 DIAGNOSIS — R451 Restlessness and agitation: Secondary | ICD-10-CM | POA: Diagnosis present

## 2017-02-01 DIAGNOSIS — E119 Type 2 diabetes mellitus without complications: Secondary | ICD-10-CM | POA: Diagnosis present

## 2017-02-01 DIAGNOSIS — R000 Tachycardia, unspecified: Secondary | ICD-10-CM | POA: Diagnosis present

## 2017-02-01 DIAGNOSIS — Z888 Allergy status to other drugs, medicaments and biological substances status: Secondary | ICD-10-CM | POA: Diagnosis not present

## 2017-02-01 DIAGNOSIS — J441 Chronic obstructive pulmonary disease with (acute) exacerbation: Secondary | ICD-10-CM

## 2017-02-01 DIAGNOSIS — Z9981 Dependence on supplemental oxygen: Secondary | ICD-10-CM

## 2017-02-01 DIAGNOSIS — N179 Acute kidney failure, unspecified: Secondary | ICD-10-CM | POA: Diagnosis present

## 2017-02-01 DIAGNOSIS — Z515 Encounter for palliative care: Secondary | ICD-10-CM | POA: Diagnosis not present

## 2017-02-01 DIAGNOSIS — I517 Cardiomegaly: Secondary | ICD-10-CM | POA: Diagnosis present

## 2017-02-01 DIAGNOSIS — J9620 Acute and chronic respiratory failure, unspecified whether with hypoxia or hypercapnia: Secondary | ICD-10-CM | POA: Diagnosis not present

## 2017-02-01 DIAGNOSIS — I4510 Unspecified right bundle-branch block: Secondary | ICD-10-CM | POA: Diagnosis present

## 2017-02-01 DIAGNOSIS — Z7951 Long term (current) use of inhaled steroids: Secondary | ICD-10-CM | POA: Diagnosis not present

## 2017-02-01 DIAGNOSIS — Z96659 Presence of unspecified artificial knee joint: Secondary | ICD-10-CM | POA: Diagnosis present

## 2017-02-01 DIAGNOSIS — I4891 Unspecified atrial fibrillation: Secondary | ICD-10-CM | POA: Diagnosis present

## 2017-02-01 DIAGNOSIS — F419 Anxiety disorder, unspecified: Secondary | ICD-10-CM | POA: Diagnosis present

## 2017-02-01 LAB — CBC
HCT: 36.8 % — ABNORMAL LOW (ref 40.0–52.0)
HCT: 34.1 % — ABNORMAL LOW (ref 40.0–52.0)
HEMOGLOBIN: 11.5 g/dL — AB (ref 13.0–18.0)
HEMOGLOBIN: 10.8 g/dL — AB (ref 13.0–18.0)
MCH: 27.2 pg (ref 26.0–34.0)
MCH: 27.5 pg (ref 26.0–34.0)
MCHC: 31.2 g/dL — AB (ref 32.0–36.0)
MCHC: 31.6 g/dL — AB (ref 32.0–36.0)
MCV: 87.2 fL (ref 80.0–100.0)
MCV: 87 fL (ref 80.0–100.0)
PLATELETS: 181 10*3/uL (ref 150–440)
Platelets: 226 10*3/uL (ref 150–440)
RBC: 4.22 MIL/uL — AB (ref 4.40–5.90)
RBC: 3.92 MIL/uL — ABNORMAL LOW (ref 4.40–5.90)
RDW: 15.5 % — ABNORMAL HIGH (ref 11.5–14.5)
RDW: 15.2 % — ABNORMAL HIGH (ref 11.5–14.5)
WBC: 9.1 10*3/uL (ref 3.8–10.6)
WBC: 6.6 10*3/uL (ref 3.8–10.6)

## 2017-02-01 LAB — BASIC METABOLIC PANEL
ANION GAP: 10 (ref 5–15)
ANION GAP: 6 (ref 5–15)
Anion gap: 7 (ref 5–15)
BUN: 21 mg/dL — ABNORMAL HIGH (ref 6–20)
BUN: 24 mg/dL — ABNORMAL HIGH (ref 6–20)
BUN: 24 mg/dL — ABNORMAL HIGH (ref 6–20)
CALCIUM: 8.9 mg/dL (ref 8.9–10.3)
CALCIUM: 8.4 mg/dL — AB (ref 8.9–10.3)
CHLORIDE: 91 mmol/L — AB (ref 101–111)
CO2: 36 mmol/L — ABNORMAL HIGH (ref 22–32)
CO2: 36 mmol/L — ABNORMAL HIGH (ref 22–32)
CO2: 38 mmol/L — ABNORMAL HIGH (ref 22–32)
CREATININE: 1.28 mg/dL — AB (ref 0.61–1.24)
Calcium: 8.7 mg/dL — ABNORMAL LOW (ref 8.9–10.3)
Chloride: 90 mmol/L — ABNORMAL LOW (ref 101–111)
Chloride: 92 mmol/L — ABNORMAL LOW (ref 101–111)
Creatinine, Ser: 1.27 mg/dL — ABNORMAL HIGH (ref 0.61–1.24)
Creatinine, Ser: 1.21 mg/dL (ref 0.61–1.24)
GFR calc Af Amer: >60 mL/min (ref >60)
GFR, EST NON AFRICAN AMERICAN: 53 mL/min — AB (ref >60)
GFR, EST NON AFRICAN AMERICAN: 53 mL/min — AB (ref >60)
GFR, EST NON AFRICAN AMERICAN: 56 mL/min — AB (ref >60)
GLUCOSE: 254 mg/dL — AB (ref 65–99)
GLUCOSE: 149 mg/dL — AB (ref 65–99)
Glucose, Bld: 280 mg/dL — ABNORMAL HIGH (ref 65–99)
POTASSIUM: 5.4 mmol/L — AB (ref 3.5–5.1)
Potassium: 5.5 mmol/L — ABNORMAL HIGH (ref 3.5–5.1)
Potassium: 5.4 mmol/L — ABNORMAL HIGH (ref 3.5–5.1)
SODIUM: 136 mmol/L (ref 135–145)
SODIUM: 134 mmol/L — AB (ref 135–145)
SODIUM: 136 mmol/L (ref 135–145)

## 2017-02-01 LAB — MRSA PCR SCREENING: MRSA BY PCR: NEGATIVE

## 2017-02-01 LAB — BLOOD GAS, VENOUS
Acid-Base Excess: 15.1 mmol/L — ABNORMAL HIGH (ref 0.0–2.0)
BICARBONATE: 44.1 mmol/L — AB (ref 20.0–28.0)
O2 Saturation: 72.6 %
PH VEN: 7.36 (ref 7.250–7.430)
PO2 VEN: 40 mmHg (ref 32.0–45.0)
Patient temperature: 37
pCO2, Ven: 78 mmHg (ref 44.0–60.0)

## 2017-02-01 LAB — INFLUENZA PANEL BY PCR (TYPE A & B)
INFLAPCR: NEGATIVE
INFLBPCR: NEGATIVE

## 2017-02-01 LAB — TROPONIN I
TROPONIN I: 0.04 ng/mL — AB (ref <0.03)
TROPONIN I: 0.04 ng/mL — AB (ref <0.03)
Troponin I: 0.04 ng/mL (ref <0.03)

## 2017-02-01 LAB — GLUCOSE, CAPILLARY: GLUCOSE-CAPILLARY: 147 mg/dL — AB (ref 65–99)

## 2017-02-01 MED ORDER — IPRATROPIUM-ALBUTEROL 0.5-2.5 (3) MG/3ML IN SOLN
3.0000 mL | Freq: Once | RESPIRATORY_TRACT | Status: AC
  Administered 2017-02-01: 9 mL via RESPIRATORY_TRACT
  Filled 2017-02-01: qty 3

## 2017-02-01 MED ORDER — INSULIN ASPART 100 UNIT/ML ~~LOC~~ SOLN
0.0000 [IU] | Freq: Three times a day (TID) | SUBCUTANEOUS | Status: DC
  Administered 2017-02-01: 11 [IU] via SUBCUTANEOUS
  Administered 2017-02-02: 4 [IU] via SUBCUTANEOUS
  Administered 2017-02-02 (×2): 3 [IU] via SUBCUTANEOUS
  Administered 2017-02-03: 7 [IU] via SUBCUTANEOUS
  Filled 2017-02-01 (×5): qty 1

## 2017-02-01 MED ORDER — POLYETHYLENE GLYCOL 3350 17 G PO PACK: 17.0000 g | PACK | Freq: Every day | ORAL | Status: DC | PRN

## 2017-02-01 MED ORDER — LORAZEPAM 2 MG/ML IJ SOLN
0.5000 mg | INTRAMUSCULAR | Status: DC | PRN
  Administered 2017-02-01 – 2017-02-03 (×5): 0.5 mg via INTRAVENOUS
  Filled 2017-02-01 (×5): qty 1

## 2017-02-01 MED ORDER — IPRATROPIUM-ALBUTEROL 0.5-2.5 (3) MG/3ML IN SOLN
RESPIRATORY_TRACT | Status: AC
  Administered 2017-02-01: 9 mL via RESPIRATORY_TRACT
  Filled 2017-02-01: qty 9

## 2017-02-01 MED ORDER — ENOXAPARIN SODIUM 40 MG/0.4ML ~~LOC~~ SOLN: 40.0000 mg | SUBCUTANEOUS | Status: DC

## 2017-02-01 MED ORDER — METHYLPREDNISOLONE SODIUM SUCC 125 MG IJ SOLR: 60.0000 mg | Freq: Four times a day (QID) | INTRAMUSCULAR | Status: DC

## 2017-02-01 MED ORDER — ONDANSETRON HCL 4 MG PO TABS: 4.0000 mg | ORAL_TABLET | Freq: Four times a day (QID) | ORAL | Status: DC | PRN

## 2017-02-01 MED ORDER — LORAZEPAM 2 MG/ML IJ SOLN
0.5000 mg | Freq: Once | INTRAMUSCULAR | Status: AC
  Administered 2017-02-01: 0.5 mg via INTRAVENOUS
  Filled 2017-02-01: qty 1

## 2017-02-01 MED ORDER — DILTIAZEM HCL ER COATED BEADS 120 MG PO CP24
120.0000 mg | ORAL_CAPSULE | Freq: Every day | ORAL | Status: DC
  Filled 2017-02-01 (×2): qty 1

## 2017-02-01 MED ORDER — ACETAMINOPHEN 650 MG RE SUPP: 650.0000 mg | Freq: Four times a day (QID) | RECTAL | Status: DC | PRN

## 2017-02-01 MED ORDER — DEXTROSE 5 % IV SOLN
500.0000 mg | INTRAVENOUS | Status: DC
  Administered 2017-02-02: 500 mg via INTRAVENOUS
  Filled 2017-02-01 (×2): qty 500

## 2017-02-01 MED ORDER — METHYLPREDNISOLONE SODIUM SUCC 125 MG IJ SOLR
125.0000 mg | INTRAMUSCULAR | Status: AC
  Administered 2017-02-01: 125 mg via INTRAVENOUS
  Filled 2017-02-01: qty 2

## 2017-02-01 MED ORDER — APIXABAN 5 MG PO TABS: 5.0000 mg | ORAL_TABLET | Freq: Two times a day (BID) | ORAL | Status: DC

## 2017-02-01 MED ORDER — IPRATROPIUM-ALBUTEROL 0.5-2.5 (3) MG/3ML IN SOLN
3.0000 mL | Freq: Once | RESPIRATORY_TRACT | Status: AC
  Administered 2017-02-01: 3 mL via RESPIRATORY_TRACT
  Filled 2017-02-01: qty 3

## 2017-02-01 MED ORDER — SODIUM CHLORIDE 0.45 % IV SOLN: INTRAVENOUS | Status: DC

## 2017-02-01 MED ORDER — AZITHROMYCIN 500 MG PO TABS
ORAL_TABLET | ORAL | Status: AC
  Filled 2017-02-01: qty 1

## 2017-02-01 MED ORDER — ACETAMINOPHEN 325 MG PO TABS: 650.0000 mg | ORAL_TABLET | Freq: Four times a day (QID) | ORAL | Status: DC | PRN

## 2017-02-01 MED ORDER — CHLORHEXIDINE GLUCONATE 0.12 % MT SOLN
15.0000 mL | Freq: Two times a day (BID) | OROMUCOSAL | Status: DC
  Administered 2017-02-01 – 2017-02-02 (×2): 15 mL via OROMUCOSAL
  Filled 2017-02-01: qty 15

## 2017-02-01 MED ORDER — ALBUTEROL SULFATE (2.5 MG/3ML) 0.083% IN NEBU
5.0000 mg | INHALATION_SOLUTION | RESPIRATORY_TRACT | Status: AC
  Administered 2017-02-01: 5 mg via RESPIRATORY_TRACT
  Filled 2017-02-01: qty 6

## 2017-02-01 MED ORDER — HYDROCODONE-ACETAMINOPHEN 5-325 MG PO TABS: 1.0000 | ORAL_TABLET | ORAL | Status: DC | PRN

## 2017-02-01 MED ORDER — AZITHROMYCIN 500 MG PO TABS
500.0000 mg | ORAL_TABLET | Freq: Once | ORAL | Status: AC
  Administered 2017-02-01: 500 mg via ORAL

## 2017-02-01 MED ORDER — BUDESONIDE 0.5 MG/2ML IN SUSP
0.5000 mg | Freq: Two times a day (BID) | RESPIRATORY_TRACT | Status: DC
  Administered 2017-02-01 – 2017-02-03 (×4): 0.5 mg via RESPIRATORY_TRACT
  Filled 2017-02-01 (×4): qty 2

## 2017-02-01 MED ORDER — ORAL CARE MOUTH RINSE
15.0000 mL | Freq: Two times a day (BID) | OROMUCOSAL | Status: DC
  Administered 2017-02-01: 15 mL via OROMUCOSAL

## 2017-02-01 MED ORDER — METHYLPREDNISOLONE SODIUM SUCC 40 MG IJ SOLR
40.0000 mg | Freq: Two times a day (BID) | INTRAMUSCULAR | Status: DC
  Administered 2017-02-02 (×2): 40 mg via INTRAVENOUS
  Filled 2017-02-01 (×2): qty 1

## 2017-02-01 MED ORDER — IPRATROPIUM-ALBUTEROL 0.5-2.5 (3) MG/3ML IN SOLN
3.0000 mL | RESPIRATORY_TRACT | Status: DC
  Administered 2017-02-01 – 2017-02-03 (×11): 3 mL via RESPIRATORY_TRACT
  Filled 2017-02-01 (×10): qty 3

## 2017-02-01 MED ORDER — IPRATROPIUM-ALBUTEROL 0.5-2.5 (3) MG/3ML IN SOLN
3.0000 mL | Freq: Four times a day (QID) | RESPIRATORY_TRACT | Status: DC
  Filled 2017-02-01: qty 3

## 2017-02-01 MED ORDER — MAGNESIUM SULFATE IN D5W 1-5 GM/100ML-% IV SOLN
1.0000 g | Freq: Once | INTRAVENOUS | Status: AC
Start: 2017-02-01 — End: 2017-02-01
  Administered 2017-02-01: 1 g via INTRAVENOUS
  Filled 2017-02-01: qty 100

## 2017-02-01 MED ORDER — ONDANSETRON HCL 4 MG/2ML IJ SOLN
4.0000 mg | Freq: Four times a day (QID) | INTRAMUSCULAR | Status: DC | PRN
Start: 2017-02-01 — End: 2017-02-03

## 2017-02-01 MED ORDER — INSULIN ASPART 100 UNIT/ML ~~LOC~~ SOLN: 0.0000 [IU] | Freq: Every day | SUBCUTANEOUS | Status: DC

## 2017-02-01 NOTE — Progress Notes (Signed)
Spoke with Dr Belia HemanKasa regarding patients Potassium level of 5.4, chloride 91, elevated bun and creatinine, blood sugar 280, troponin level 0.04. Gave patient 11 units of reg. Insulin. Per Dr. Belia HemanKasa he wants a repeat BMP in 6 hours. No additional orders at this time.

## 2017-02-01 NOTE — ED Notes (Signed)
Pt arrives to ED with resp distress. On 5 L nasal cannula chronically. Audible wheezing heard. Pt sounds tight, not moving much air on arrival. Refusing bipap. States he can't tolerate it. Wife at bedside. Pt is HOH.

## 2017-02-01 NOTE — Progress Notes (Signed)
Per D.r Belia HemanKasa will place labs orders for re-check. Aware of potassium of 5.5. MD will wait until new labs result.

## 2017-02-01 NOTE — Consult Note (Signed)
Name: Jeremy PlanasJackie N Haggart Sr. MRN: [id number] DOB: Nov 07, 1940    ADMISSION DATE:  10-08-2016 CONSULTATION DATE: 10-08-2016  REFERRING MD : Dr. Katheren ShamsSalary   CHIEF COMPLAINT: Shortness of Breath   BRIEF PATIENT DESCRIPTION:  76 yo male admitted 12/31 with acute on chronic hypoxic hypercapnic respiratory failure secondary to AECOPD and acute renal failure with hyperkalemia   SIGNIFICANT EVENTS  [facility]   STUDIES:  None   HISTORY OF PRESENT ILLNESS:   This is a 76 yo male with a PMH of COPD/Emphysema, Diabetes, and Chronic Pain.  [facility] with c/o shortness of breath, wheezing, and cough onset of symptoms 1 month ago, however symptoms worsened prompting ER visit.  He wears chronic home O2 at 5L.  Pt initially refused Bipap in the ER due to severe claustrophobia, however due to increased work of breathing pt agreed to Bipap after administration of low dose ativan.  Lab results revealed creatinine 1.28, K+ 5.4, troponin 0.04, and CXR negative.  He was subsequently admitted to the stepdown unit by hospitalist team for further workup and treatment.   PAST MEDICAL HISTORY :   has a past medical history of Chronic pain, COPD (chronic obstructive pulmonary disease) (HCC), Diabetes mellitus without complication (HCC), and Emphysema/COPD (HCC).  has a past surgical history that includes Replacement total knee. Prior to Admission medications   Medication Sig Start Date End Date Taking? Authorizing Provider  albuterol (PROVENTIL HFA;VENTOLIN HFA) 108 (90 Base) MCG/ACT inhaler Inhale 1-2 puffs into the lungs every 4 (four) hours as needed for wheezing or shortness of breath.   Yes [provider]  apixaban (ELIQUIS) 5 MG TABS tablet Take 1 tablet (5 mg total) by mouth 2 (two) times daily. Patient taking differently: Take 5 mg by mouth every evening.  10/26/16  Yes Auburn BilberryPatel, Shreyang, MD  Fluticasone-Salmeterol (ADVAIR DISKUS) 250-50 MCG/DOSE AEPB  Inhale 1 puff into the lungs 2 (two) times daily.   Yes [provider]  glipiZIDE (GLUCOTROL) 10 MG tablet Take 10 mg by mouth 2 (two) times daily before a meal.   Yes [provider]  ipratropium-albuterol (DUONEB) 0.5-2.5 (3) MG/3ML SOLN Take 3 mLs by nebulization every 6 (six) hours. 10/26/16  Yes Auburn BilberryPatel, Shreyang, MD  metFORMIN (GLUCOPHAGE) 1000 MG tablet Take 1,000 mg by mouth 2 (two) times daily with a meal.   Yes [provider]  montelukast (SINGULAIR) 10 MG tablet Take 10 mg by mouth at bedtime.   Yes [provider]  morphine (MS CONTIN) 30 MG 12 hr tablet Take 30 mg by mouth 3 (three) times daily.   Yes [provider]  omeprazole (PRILOSEC) 20 MG capsule Take 20 mg by mouth daily.   Yes [provider]  ondansetron (ZOFRAN) 4 MG tablet Take 4 mg by mouth 3 (three) times daily as needed. 01/29/17  Yes [provider]  predniSONE (DELTASONE) 20 MG tablet Take 20 mg by mouth daily with breakfast.   Yes [provider]  diltiazem (CARDIZEM CD) 120 MG 24 hr capsule Take 1 capsule (120 mg total) by mouth daily. Patient not taking: Reported on 10-08-2016 10/26/16   Auburn BilberryPatel, Shreyang, MD  guaiFENesin (MUCINEX) 600 MG 12 hr tablet Take 1 tablet (600 mg total) by mouth 2 (two) times daily. Patient not taking: Reported on 10-08-2016 10/26/16   Auburn BilberryPatel, Shreyang, MD  nicotine (NICODERM CQ - DOSED IN MG/24 HOURS) 14 mg/24hr patch Place 1 patch (14 mg total) onto the skin  daily. Patient not taking: Reported on 10/22/2016 07/08/16   Houston SirenSainani, Vivek J, MD  predniSONE (STERAPRED UNI-PAK 21 TAB) 10 MG (21) TBPK tablet Start at 60mg  taper by 10mg  until complete Patient not taking: Reported on 01/07/2017 10/26/16   Auburn BilberryPatel, Shreyang, MD  tiotropium (SPIRIVA HANDIHALER) 18 MCG inhalation capsule Place 1 capsule (18 mcg total) into inhaler and inhale daily. Patient not taking: Reported on 01/05/2017 10/26/16   Auburn BilberryPatel, Shreyang, MD   Allergies    Allergen Reactions  . Ambien [Zolpidem] Nausea And Vomiting  . Codeine Nausea And Vomiting    FAMILY HISTORY:  family history includes CVA in his father. SOCIAL HISTORY:  reports that he has been smoking cigarettes.  He has been smoking about 0.50 packs per day. he has never used smokeless tobacco. He reports that he does not drink alcohol or use drugs.  REVIEW OF SYSTEMS:   Unable to assess pt on Bipap   SUBJECTIVE:  Unable to assess pt on Bipap   VITAL SIGNS: Temp:  [97.4 F (36.3 C)-98.2 F (36.8 C)] 97.4 F (36.3 C) (12/31 1617) Pulse Rate:  [50-114] 89 (12/31 1827) Resp:  [16-39] 22 (12/31 1827) BP: (91-154)/(52-122) 105/54 (12/31 1827) SpO2:  [82 %-100 %] 98 % (12/31 1827) Weight:  [84.8 kg (187 lb)] 84.8 kg (187 lb) (12/31 1221)  PHYSICAL EXAMINATION: General: well developed, well nourished male, NAD  Neuro: lethargic, oriented, follows commands HEENT: supple, no JVD  Cardiovascular: sinus rhythm, rrr, no M/R/G Lungs: diminished throughout, even, non labored on Bipap  Abdomen: +BS x4, soft, non tender, non distended  Musculoskeletal: normal bulk and tone, no edema  Skin: intact no rashes or lesions   Recent Labs  Lab 01/11/2017 1221 01/22/2017 1628  NA 136 134*  K 5.5* 5.4*  CL 90* 91*  CO2 36* 36*  BUN 21* 24*  CREATININE 1.27* 1.28*  GLUCOSE 254* 280*   Recent Labs  Lab 01/03/2017 1221 01/08/2017 1628  HGB 11.5* 10.8*  HCT 36.8* 34.1*  WBC 9.1 6.6  PLT 226 181   Dg Chest Portable 1 View  Result Date: 01/06/2017 CLINICAL DATA:  Shortness of Breath EXAM: PORTABLE CHEST 1 VIEW COMPARISON:  October 22, 2016 FINDINGS: There is no edema or consolidation. Heart is slightly enlarged with pulmonary vascularity within normal limits. There is aortic atherosclerosis. No adenopathy. No bone lesions. IMPRESSION: Mild cardiomegaly. Aortic atherosclerosis. No edema or consolidation. Aortic Atherosclerosis (ICD10-I70.0). Electronically Signed   By: Bretta BangWilliam  Woodruff  III M.D.   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 secondary to AECOPD Acute renal failure with hyperkalemia  Elevated troponin's likely secondary to demand ischemia in setting of respiratory failure  Hx: Diabetes Mellitus P: Prn Bipap for dyspnea and/or hypoxia Maintain O2 sats 88% to 92% Continue scheduled and prn bronchodilator therapy  Continue iv and nebulized steroids  Prn CXR Prn low dose ativan for anxiety  Continue azithromycin x5 doses Trend WBC and monitor fever curve   Trend BMP Replace electrolytes as indicated Monitor UOP Continue outpatient cardiac medications Continuous telemetry monitoring  Trend troponin's  Eliquis for VTE prophylaxis  Trend CBC Monitor for s/sx of bleeding Transfuse for hgb <7 CBG's ac/hs and SSI   Sonda Rumbleana Anayely Constantine, AGNP  Pulmonary/Critical Care Pager [phone] (please enter 7 digits) PCCM Consult Pager [phone] (please enter 7 digits)

## 2017-02-01 NOTE — Therapy (Signed)
Pt agreed to try the full face mask.  He is tolerating 12/6 35%-FiO2 saturation 98%.  No issues with the mask at this time.

## 2017-02-01 NOTE — ED Notes (Signed)
X-ray at bedside

## 2017-02-01 NOTE — H&P (Signed)
[employer] - [place] at [facility]   PATIENT NAME: Jeremy Wells    MR#:  [id number]  DATE OF BIRTH:  [birth year]  DATE OF ADMISSION:  01/16/2017  PRIMARY CARE PHYSICIAN: Danella Penton, MD   REQUESTING/REFERRING PHYSICIAN:   CHIEF COMPLAINT:   Chief Complaint  Patient presents with  . Shortness of Breath    HISTORY OF PRESENT ILLNESS: Jeremy Wells  is a 76 y.o. male with a known history of chronic respiratory failure on 4-5 L of oxygen at home, COPD, DNR status presents to the emergency room acute shortness of breath worsening over the last 1 day, patient noted to have productive cough with associated wheezing, in the emergency room patient was initially refusing BiPAP, given Ativan for anxiety, chest x-ray noted for cardiomegaly, potassium 5.5, O2 saturation initially in the 80s on 2 L, creatinine 1.2, chloride 90, EKG noted for sinus tachycardia rate of 105 with right bundle branch block, patient evaluated in the emergency room-poor historian/sedated from medications, wife/daughter at the bedside, patient is now being admitted for acute on COPD exacerbation and associated acute on chronic hypoxic hypercarbic respiratory failure.  PAST MEDICAL HISTORY:   Past Medical History:  Diagnosis Date  . Chronic pain   . COPD (chronic obstructive pulmonary disease) (HCC)    on 4L home oxygen  . Diabetes mellitus without complication (HCC)   . Emphysema/COPD [facility])     PAST SURGICAL HISTORY:  Past Surgical History:  Procedure Laterality Date  . REPLACEMENT TOTAL KNEE      SOCIAL HISTORY:  Social History   Tobacco Use  . Smoking status: Current Every Day Smoker    Packs/day: 0.50    Types: Cigarettes  . Smokeless tobacco: Never Used  Substance Use Topics  . Alcohol use: No    FAMILY HISTORY:  Family History  Problem Relation Age of Onset  . CVA Father     DRUG ALLERGIES:  Allergies  Allergen Reactions  . Ambien [Zolpidem] Nausea And Vomiting  .  Codeine Nausea And Vomiting    REVIEW OF SYSTEMS:  Poor historian due to sedation   MEDICATIONS AT HOME:  Prior to Admission medications   Medication Sig Start Date End Date Taking? Authorizing Provider  albuterol (PROVENTIL HFA;VENTOLIN HFA) 108 (90 Base) MCG/ACT inhaler Inhale 1-2 puffs into the lungs every 4 (four) hours as needed for wheezing or shortness of breath.    [provider]  apixaban (ELIQUIS) 5 MG TABS tablet Take 1 tablet (5 mg total) by mouth 2 (two) times daily. 10/26/16   Auburn Bilberry, MD  diltiazem (CARDIZEM CD) 120 MG 24 hr capsule Take 1 capsule (120 mg total) by mouth daily. 10/26/16   Auburn Bilberry, MD  Fluticasone-Salmeterol (ADVAIR DISKUS) 250-50 MCG/DOSE AEPB Inhale 1 puff into the lungs 2 (two) times daily.    [provider]  glipiZIDE (GLUCOTROL) 10 MG tablet Take 10 mg by mouth 2 (two) times daily before a meal.    [provider]  guaiFENesin (MUCINEX) 600 MG 12 hr tablet Take 1 tablet (600 mg total) by mouth 2 (two) times daily. Patient not taking: Reported on 01/31/2017 10/26/16   Auburn Bilberry, MD  ipratropium-albuterol (DUONEB) 0.5-2.5 (3) MG/3ML SOLN Take 3 mLs by nebulization every 6 (six) hours. 10/26/16   Auburn Bilberry, MD  metFORMIN (GLUCOPHAGE) 1000 MG tablet Take 1,000 mg by mouth 2 (two) times daily with a meal.    [provider]  montelukast (SINGULAIR) 10 MG tablet Take 10 mg  by mouth at bedtime.    [provider]  morphine (MSIR) 30 MG tablet Take 30 mg by mouth 3 (three) times daily.    [provider]  nicotine (NICODERM CQ - DOSED IN MG/24 HOURS) 14 mg/24hr patch Place 1 patch (14 mg total) onto the skin daily. Patient not taking: Reported on 10/22/2016 07/08/16   Houston SirenSainani, Vivek J, MD  omeprazole (PRILOSEC) 20 MG capsule Take 20 mg by mouth daily.    [provider]  predniSONE (STERAPRED UNI-PAK 21 TAB) 10 MG (21) TBPK tablet Start at 60mg  taper by 10mg  until complete Patient  not taking: Reported on 01/07/2017 10/26/16   Auburn BilberryPatel, Shreyang, MD  tiotropium (SPIRIVA HANDIHALER) 18 MCG inhalation capsule Place 1 capsule (18 mcg total) into inhaler and inhale daily. 10/26/16   Auburn BilberryPatel, Shreyang, MD      PHYSICAL EXAMINATION:   VITAL SIGNS: Blood pressure 115/69, pulse (!) 110, temperature 98.2 F (36.8 C), temperature source Oral, resp. rate (!) 24, height 5\' 10"  (1.778 m), weight 84.8 kg (187 lb), SpO2 100 %.  GENERAL:  76 y.o.-year-old patient lying in the bed with no acute distress.  Obese, nontoxic-appearing EYES: Pupils equal, round, reactive to light and accommodation. No scleral icterus. Extraocular muscles intact.  HEENT: Head atraumatic, normocephalic. Oropharynx and nasopharynx clear.  NECK:  Supple, no jugular venous distention. No thyroid enlargement, no tenderness.  LUNGS: Severely diminished breath sounds bilaterally. No use of accessory muscles of respiration.  CARDIOVASCULAR: S1, S2 normal. No murmurs, rubs, or gallops.  ABDOMEN: Soft, nontender, nondistended. Bowel sounds present. No organomegaly or mass.  Umbilical hernia-easily reducible EXTREMITIES: No pedal edema, cyanosis, or clubbing.  NEUROLOGIC: Cranial nerves II through XII are intact. MAES. Gait not checked.  PSYCHIATRIC: Lethargic/sedated SKIN: No obvious rash, lesion, or ulcer.   LABORATORY PANEL:   CBC Recent Labs  Lab 01/14/2017 1221  WBC 9.1  HGB 11.5*  HCT 36.8*  PLT 226  MCV 87.2  MCH 27.2  MCHC 31.2*  RDW 15.5*   ------------------------------------------------------------------------------------------------------------------  Chemistries  Recent Labs  Lab 01/12/2017 1221  NA 136  K 5.5*  CL 90*  CO2 36*  GLUCOSE 254*  BUN 21*  CREATININE 1.27*  CALCIUM 8.9   ------------------------------------------------------------------------------------------------------------------ estimated creatinine clearance is 51.1 mL/min (A) (by C-G formula based on SCr of 1.27 mg/dL  (H)). ------------------------------------------------------------------------------------------------------------------ No results for input(s): TSH, T4TOTAL, T3FREE, THYROIDAB in the last 72 hours.  Invalid input(s): FREET3   Coagulation profile No results for input(s): INR, PROTIME in the last 168 hours. ------------------------------------------------------------------------------------------------------------------- No results for input(s): DDIMER in the last 72 hours. -------------------------------------------------------------------------------------------------------------------  Cardiac Enzymes Recent Labs  Lab 01/08/2017 1221  TROPONINI 0.04*   ------------------------------------------------------------------------------------------------------------------ Invalid input(s): POCBNP  ---------------------------------------------------------------------------------------------------------------  Urinalysis    Component Value Date/Time   COLORURINE YELLOW (A) 10/25/2016 1450   APPEARANCEUR CLEAR (A) 10/25/2016 1450   LABSPEC 1.013 10/25/2016 1450   PHURINE 6.0 10/25/2016 1450   GLUCOSEU >=500 (A) 10/25/2016 1450   HGBUR MODERATE (A) 10/25/2016 1450   BILIRUBINUR NEGATIVE 10/25/2016 1450   KETONESUR NEGATIVE 10/25/2016 1450   PROTEINUR 100 (A) 10/25/2016 1450   NITRITE NEGATIVE 10/25/2016 1450   LEUKOCYTESUR NEGATIVE 10/25/2016 1450     RADIOLOGY: Dg Chest Portable 1 View  Result Date: 01/24/2017 CLINICAL DATA:  Shortness of Breath EXAM: PORTABLE CHEST 1 VIEW COMPARISON:  October 22, 2016 FINDINGS: There is no edema or consolidation. Heart is slightly enlarged with pulmonary vascularity within normal limits. There is aortic atherosclerosis. No adenopathy. No bone  lesions. IMPRESSION: Mild cardiomegaly. Aortic atherosclerosis. No edema or consolidation. Aortic Atherosclerosis (ICD10-I70.0). Electronically Signed   By: Bretta BangWilliam  Woodruff III M.D.   On: 01/12/2017  12:54    EKG: Orders placed or performed during the hospital encounter of 01/19/2017  . ED EKG  . ED EKG  . EKG 12-Lead  . EKG 12-Lead    IMPRESSION AND PLAN: 1 acute on chronic hypoxic hypercarbic respiratory failure due to acute on COPD exacerbation Admit to stepdown unit, BiPAP with weaning as tolerated, supplemental oxygen with weaning to baseline O2 requirement of 4-5 L continuous, inhaled corticosteroids twice daily, IV Solu-Medrol with tapering as tolerated, empiric azithromycin for 5-day course, check sputum cultures, respiratory therapy to see, and continue close medical monitoring Of note per wife-primary care provider had recommended hospice consultation, was due for home evaluation on today, family currently not interested in hospice at this time  2 acute on COPD exacerbation Plan of care as stated above  3 chronic diabetes mellitus type 2 Complete MAR when available, sliding scale insulin with Accu-Cheks per routine for now  4 chronic pain syndrome Stable  DNR status per family Condition stable Long-term prognosis poor DVT prophylaxis with Lovenox subcu Disposition Home in 2-3 days barring any complications  All the records are reviewed and case discussed with ED provider. Management plans discussed with the patient, family and they are in agreement.  CODE STATUS: Code Status History    Date Active Date Inactive Code Status Order ID Comments User Context   10/22/2016 17:17 10/26/2016 15:58 Full Code [id number]  Ramonita LabGouru, Aruna, MD Inpatient   07/05/2016 18:19 07/07/2016 15:06 Full Code [id number]  Enid BaasKalisetti, Radhika, MD Inpatient       TOTAL TIME TAKING CARE OF THIS PATIENT: 45 minutes.    Evelena AsaMontell D Van Ehlert M.D on 01/22/2017   Between 7am to 6pm - Pager - [phone]  After 6pm go to [email or web address] - password [email or web address]  [facility]  Office  [phone]  CC: Primary care physician; Danella PentonMiller, Mark F, MD   Note: This dictation was prepared with  Dragon dictation along with smaller phrase technology. Any transcriptional errors that result from this process are unintentional.

## 2017-02-01 NOTE — ED Triage Notes (Signed)
[facility] reports [facility]'t [facility].  Labored. On 2 L [place] on arrival but supposed to be on 5. sats improved on his 5 L .  No fevers. No pain.

## 2017-02-01 NOTE — Progress Notes (Signed)
Please note, patient was to be opened to [facility] services at home today, but [facility] and was admitted prior to hospice admission. CMRN Collie Siadngela Johnson made aware.  Dayna BarkerKaren Robertson RN, BSN, [facility], [facility] Liaison [phone]

## 2017-02-01 NOTE — ED Provider Notes (Signed)
[facility] Emergency Department Provider Note  ____________________________________________   First MD Initiated Contact with Patient 01/25/2017 1224     (approximate)  I have reviewed the triage vital signs and the nursing notes.   HISTORY  Chief Complaint Shortness of Breath    HPI Jeremy PlanasJackie N Anderegg Sr. is a 76 y.o. male notable history of COPD, A. fib diabetes emphysema previous respiratory failure  Patient presents today reports for about 1 month he had increasing shortness of breath wheezing coughing.  He saw his doctor at the end of last week, reports his symptoms continue to worsen.  Wife is been encouraging him to come in, but he has decided to come in today.  Denies being in pain.  No nausea vomiting.  Just reports very short of breath, wheezing off and on.  Feels the same as when he has "emphysema"  Uses 4 L nasal cannula at home at all times  Past Medical History:  Diagnosis Date  . Chronic pain   . COPD (chronic obstructive pulmonary disease) (HCC)    on 4L home oxygen  . Diabetes mellitus without complication (HCC)   . Emphysema/COPD [facility](HCC)     Patient Active Problem List   Diagnosis Date Noted  . Acute respiratory failure (HCC) 10/22/2016  . COPD exacerbation (HCC) 07/05/2016    Past Surgical History:  Procedure Laterality Date  . REPLACEMENT TOTAL KNEE      Prior to Admission medications   Medication Sig Start Date End Date Taking? Authorizing Provider  albuterol (PROVENTIL HFA;VENTOLIN HFA) 108 (90 Base) MCG/ACT inhaler Inhale 1-2 puffs into the lungs every 4 (four) hours as needed for wheezing or shortness of breath.    [provider]  apixaban (ELIQUIS) 5 MG TABS tablet Take 1 tablet (5 mg total) by mouth 2 (two) times daily. 10/26/16   Auburn BilberryPatel, Shreyang, MD  diltiazem (CARDIZEM CD) 120 MG 24 hr capsule Take 1 capsule (120 mg total) by mouth daily. 10/26/16   Auburn BilberryPatel, Shreyang, MD  Fluticasone-Salmeterol (ADVAIR DISKUS)  250-50 MCG/DOSE AEPB Inhale 1 puff into the lungs 2 (two) times daily.    [provider]  glipiZIDE (GLUCOTROL) 10 MG tablet Take 10 mg by mouth 2 (two) times daily before a meal.    [provider]  guaiFENesin (MUCINEX) 600 MG 12 hr tablet Take 1 tablet (600 mg total) by mouth 2 (two) times daily. Patient not taking: Reported on 01/10/2017 10/26/16   Auburn BilberryPatel, Shreyang, MD  ipratropium-albuterol (DUONEB) 0.5-2.5 (3) MG/3ML SOLN Take 3 mLs by nebulization every 6 (six) hours. 10/26/16   Auburn BilberryPatel, Shreyang, MD  metFORMIN (GLUCOPHAGE) 1000 MG tablet Take 1,000 mg by mouth 2 (two) times daily with a meal.    [provider]  montelukast (SINGULAIR) 10 MG tablet Take 10 mg by mouth at bedtime.    [provider]  morphine (MSIR) 30 MG tablet Take 30 mg by mouth 3 (three) times daily.    [provider]  nicotine (NICODERM CQ - DOSED IN MG/24 HOURS) 14 mg/24hr patch Place 1 patch (14 mg total) onto the skin daily. Patient not taking: Reported on 10/22/2016 07/08/16   Houston SirenSainani, Vivek J, MD  omeprazole (PRILOSEC) 20 MG capsule Take 20 mg by mouth daily.    [provider]  predniSONE (STERAPRED UNI-PAK 21 TAB) 10 MG (21) TBPK tablet Start at 60mg  taper by 10mg  until complete Patient not taking: Reported on 01/30/2017 10/26/16   Auburn BilberryPatel, Shreyang, MD  tiotropium (SPIRIVA HANDIHALER) [phone]  MCG inhalation capsule Place 1 capsule (18 mcg total) into inhaler and inhale daily. 10/26/16   Auburn Bilberry, MD    Allergies Ambien [zolpidem] and Codeine  Family History  Problem Relation Age of Onset  . CVA Father     Social History Social History   Tobacco Use  . Smoking status: Current Every Day Smoker    Packs/day: 0.50    Types: Cigarettes  . Smokeless tobacco: Never Used  Substance Use Topics  . Alcohol use: No  . Drug use: No    Review of Systems Constitutional: No fever/chills Eyes: No visual changes. ENT: No sore throat. Cardiovascular: Denies chest  pain. Respiratory: See HPI gastrointestinal: No abdominal pain.  No nausea, no vomiting.  No diarrhea.  No constipation. Genitourinary: Negative for dysuria. Musculoskeletal: Negative for back pain. Skin: Negative for rash. Neurological: Negative for headaches, focal weakness or numbness.    ____________________________________________   PHYSICAL EXAM:  VITAL SIGNS: ED Triage Vitals  Enc Vitals Group     BP 01/25/2017 1215 (!) 154/122     Pulse Rate 01/13/2017 1215 (!) 114     Resp --      Temp 01/23/2017 1215 98.2 F (36.8 C)     Temp Source 01/03/2017 1215 Oral     SpO2 01/27/2017 1215 (!) 82 %     Weight 01/20/2017 1221 187 lb (84.8 kg)     Height 01/12/2017 1221 5\' 10"  (1.778 m)     Head Circumference --      Peak Flow --      Pain Score --      Pain Loc --      Pain Edu? --      Excl. in GC? --     Constitutional: Alert and oriented.  Moderately ill-appearing, laying on his right side, appears moderately dyspneic.  Speaks in 2-3 word sentences and phrases. Eyes: Conjunctivae are normal. Head: Atraumatic. Nose: No congestion/rhinnorhea. Mouth/Throat: Mucous membranes are moist. Neck: No stridor.   Cardiovascular: Slightly tachycardic rate, regular rhythm. Grossly normal heart sounds.  Good peripheral circulation. Respiratory: Moderate increased work of breathing, frequent somewhat shallow respirations with poor air movement throughout, mild end expiratory wheezing noted throughout multiple lung fields.  He is not tripoding and is not appear to be in failure or risk of immediate respiratory collapse, but he does appear moderately ill, definitely increased work of breathing.  Use of accessory muscles.  On 5 L nasal cannula Gastrointestinal: Soft and nontender. No distention. Musculoskeletal: No lower extremity tenderness nor edema. Neurologic:  Normal speech and language. No gross focal neurologic deficits are appreciated.  Skin:  Skin is warm, dry and intact. No rash  noted. Psychiatric: Mood and affect are anxious. Speech and behavior are normal.  ____________________________________________   LABS (all labs ordered are listed, but only abnormal results are displayed)  Labs Reviewed  BASIC METABOLIC PANEL - Abnormal; Notable for the following components:      Result Value   Potassium 5.5 (*)    Chloride 90 (*)    CO2 36 (*)    Glucose, Bld 254 (*)    BUN 21 (*)    Creatinine, Ser 1.27 (*)    GFR calc non Af Amer 53 (*)    All other components within normal limits  CBC - Abnormal; Notable for the following components:   RBC 4.22 (*)    Hemoglobin 11.5 (*)    HCT 36.8 (*)    MCHC 31.2 (*)    RDW  15.5 (*)    All other components within normal limits  TROPONIN I - Abnormal; Notable for the following components:   Troponin I 0.04 (*)    All other components within normal limits  BLOOD GAS, VENOUS - Abnormal; Notable for the following components:   pCO2, Ven 78 (*)    Bicarbonate 44.1 (*)    Acid-Base Excess 15.1 (*)    All other components within normal limits   ____________________________________________  EKG  Reviewed interrupt me at 1230 Heart rate 105 Cures 135 QTC 440 Sinus tachycardia with right bundle branch block, no obvious ischemia noted ____________________________________________  RADIOLOGY  Dg Chest Portable 1 View  Result Date: 01/31/2017 CLINICAL DATA:  Shortness of Breath EXAM: PORTABLE CHEST 1 VIEW COMPARISON:  October 22, 2016 FINDINGS: There is no edema or consolidation. Heart is slightly enlarged with pulmonary vascularity within normal limits. There is aortic atherosclerosis. No adenopathy. No bone lesions. IMPRESSION: Mild cardiomegaly. Aortic atherosclerosis. No edema or consolidation. Aortic Atherosclerosis (ICD10-I70.0). Electronically Signed   By: Bretta BangWilliam  Woodruff III M.D.   On: 01/19/2017 12:54    ____________________________________________   PROCEDURES  Procedure(s) performed:  None  Procedures  Critical Care performed: Yes, see critical care note(s)  CRITICAL CARE Performed by: Sharyn CreamerQUALE, MARK   Total critical care time: 40 minutes  Critical care time was exclusive of separately billable procedures and treating other patients.  Critical care was necessary to treat or prevent imminent or life-threatening deterioration.  Critical care was time spent personally by me on the following activities: development of treatment plan with patient and/or surrogate as well as nursing, discussions with consultants, evaluation of patient's response to treatment, examination of patient, obtaining history from patient or surrogate, ordering and performing treatments and interventions, ordering and review of laboratory studies, ordering and review of radiographic studies, pulse oximetry and re-evaluation of patient's condition.  ____________________________________________   INITIAL IMPRESSION / ASSESSMENT AND PLAN / ED COURSE  Pertinent labs & imaging results that were available during my care of the patient were reviewed by me and considered in my medical decision making (see chart for details).  Transfer dyspnea.  Wheezing, increased work of breathing with a long history of COPD.  States same today, no associated pain.  Blood work reassuring except for an elevated PCO2 but normal pH.  His increased work of breathing with dyspnea and wheezing and presentation today appears consistent with severe COPD exacerbation.  Clinical Course as of Feb 01 1400  Mon Feb 01, 2017  1231 Spoke with both the patient and his wife about placing him on BiPAP, he reports he has severe claustrophobia and cannot tolerate this.  Patient refuses to be placed on BiPAP, understands that it may help his condition and possibly improve him and prevent him from worsening to the point he would have to have a had a breathing tube and be on a ventilator.  Patient acknowledges the risks, but adamantly refuses any  attempt at BiPAP.  [MQ]  1232 Has a valid DNR completed by Dr. Bethann PunchesMark Miller. Wife presents it.   [MQ]  1321 Patient appearing anxious, continues with moderate increased work of breathing.  At this point, I am concerned that he will eventually tired, I have talked to him and his family, and we will trial BiPAP to see if he can tolerate it, I have also ordered a small dose of Ativan.  Patient reports he has done BiPAP in the past, but really does not like it, they at this point  we actually have to try it as otherwise I suspect he will progress to respiratory failure.  Patient and family aware of his critical nature  [MQ]  1323 pCO2, Ven: (!!) 78 [MQ]  1323 Troponin I: (!!) 0.04 [MQ]  1323 Potassium: (!) 5.5 [MQ]  1323 Creatinine: (!) 1.27 [MQ]    Clinical Course User Index [MQ] Sharyn Creamer, MD   ----------------------------------------- 2:00 PM on 01/05/2017 -----------------------------------------  Patient placed on BiPAP, he is tolerating it well at this time.  Appears much more comfortable.  He does not appear to be showing any concerns or significant anxiety about the use of the BiPAP, tolerating well.  Understandable and agreeable with plan for admission. ____________________________________________   FINAL CLINICAL IMPRESSION(S) / ED DIAGNOSES  Final diagnoses:  COPD exacerbation (HCC)  Acute and chr resp failure, unsp w hypoxia or hypercapnia (HCC)      NEW MEDICATIONS STARTED DURING THIS VISIT:  This SmartLink is deprecated. Use AVSMEDLIST instead to display the medication list for a patient.   Note:  This document was prepared using Dragon voice recognition software and may include unintentional dictation errors.     Sharyn Creamer, MD 01/20/2017 [id number]

## 2017-02-01 NOTE — ED Notes (Signed)
Date and time results received: 06-08-16 1315   Test: troponin Critical Value: 0.04  Name of Provider Notified: Dr. Fanny BienQuale

## 2017-02-02 LAB — CBC WITH DIFFERENTIAL/PLATELET
BASOS ABS: 0 10*3/uL (ref 0–0.1)
BASOS PCT: 0 %
EOS PCT: 0 %
Eosinophils Absolute: 0 10*3/uL (ref 0–0.7)
HEMATOCRIT: 32.7 % — AB (ref 40.0–52.0)
Hemoglobin: 10.4 g/dL — ABNORMAL LOW (ref 13.0–18.0)
Lymphocytes Relative: 5 %
Lymphs Abs: 0.2 10*3/uL — ABNORMAL LOW (ref 1.0–3.6)
MCH: 27.3 pg (ref 26.0–34.0)
MCHC: 31.8 g/dL — ABNORMAL LOW (ref 32.0–36.0)
MCV: 85.9 fL (ref 80.0–100.0)
MONO ABS: 0.6 10*3/uL (ref 0.2–1.0)
Monocytes Relative: 10 %
NEUTROS ABS: 4.6 10*3/uL (ref 1.4–6.5)
Neutrophils Relative %: 85 %
PLATELETS: 202 10*3/uL (ref 150–440)
RBC: 3.81 MIL/uL — ABNORMAL LOW (ref 4.40–5.90)
RDW: 15.3 % — AB (ref 11.5–14.5)
WBC: 5.5 10*3/uL (ref 3.8–10.6)

## 2017-02-02 LAB — BASIC METABOLIC PANEL
Anion gap: 8 (ref 5–15)
BUN: 26 mg/dL — ABNORMAL HIGH (ref 6–20)
CALCIUM: 8.9 mg/dL (ref 8.9–10.3)
CO2: 37 mmol/L — AB (ref 22–32)
CREATININE: 1.29 mg/dL — AB (ref 0.61–1.24)
Chloride: 91 mmol/L — ABNORMAL LOW (ref 101–111)
GFR calc non Af Amer: 52 mL/min — ABNORMAL LOW (ref >60)
GLUCOSE: 160 mg/dL — AB (ref 65–99)
Potassium: 5.1 mmol/L (ref 3.5–5.1)
Sodium: 136 mmol/L (ref 135–145)

## 2017-02-02 LAB — TROPONIN I: TROPONIN I: 0.03 ng/mL — AB (ref <0.03)

## 2017-02-02 LAB — GLUCOSE, CAPILLARY
GLUCOSE-CAPILLARY: 286 mg/dL — AB (ref 65–99)
GLUCOSE-CAPILLARY: 145 mg/dL — AB (ref 65–99)
Glucose-Capillary: 148 mg/dL — ABNORMAL HIGH (ref 65–99)
Glucose-Capillary: 157 mg/dL — ABNORMAL HIGH (ref 65–99)
Glucose-Capillary: 107 mg/dL — ABNORMAL HIGH (ref 65–99)

## 2017-02-02 MED ORDER — MIDAZOLAM HCL 2 MG/2ML IJ SOLN
INTRAMUSCULAR | Status: AC
  Administered 2017-02-02: 2 mg via INTRAVENOUS
  Filled 2017-02-02: qty 2

## 2017-02-02 MED ORDER — MIDAZOLAM HCL 2 MG/2ML IJ SOLN
2.0000 mg | Freq: Once | INTRAMUSCULAR | Status: AC
  Administered 2017-02-02: 2 mg via INTRAVENOUS

## 2017-02-02 MED ORDER — MIDAZOLAM HCL 2 MG/2ML IJ SOLN
2.0000 mg | INTRAMUSCULAR | Status: DC | PRN
Start: 2017-02-02 — End: 2017-02-03
  Administered 2017-02-02: 2 mg via INTRAVENOUS

## 2017-02-02 MED ORDER — DEXMEDETOMIDINE HCL IN NACL 400 MCG/100ML IV SOLN
0.4000 ug/kg/h | INTRAVENOUS | Status: DC
  Administered 2017-02-02: 1.4 ug/kg/h via INTRAVENOUS
  Administered 2017-02-02: 0.4 ug/kg/h via INTRAVENOUS
  Administered 2017-02-02: 1.4 ug/kg/h via INTRAVENOUS
  Administered 2017-02-02: 1.2 ug/kg/h via INTRAVENOUS
  Administered 2017-02-03 (×2): 1.4 ug/kg/h via INTRAVENOUS
  Filled 2017-02-02 (×7): qty 100

## 2017-02-02 MED ORDER — MORPHINE SULFATE (PF) 2 MG/ML IV SOLN
2.0000 mg | INTRAVENOUS | Status: DC | PRN
  Administered 2017-02-02 (×5): 2 mg via INTRAVENOUS
  Filled 2017-02-02 (×6): qty 1

## 2017-02-02 NOTE — Progress Notes (Signed)
Dr.Kasa called to patients room due to ongoing agitation despite morphine. Pt continues to show increase WOB and complain of SOB. Verbal orders for Precedex drip. Orders read back and acknowledged

## 2017-02-02 NOTE — Progress Notes (Signed)
Spoke with Dr. Belia HemanKasa regarding patients ongoing agitation. Pt does take 30 mg morphine at home. Verbal orders given 2 mg of morphine q 30 min for SOB and anxiety. Orders read back and acknowledged. Will continue to assess.

## 2017-02-02 NOTE — Progress Notes (Signed)
[employer] - [place] at [facility]   PATIENT NAME: Yandriel Boening    MR#:  [id number]  DATE OF BIRTH:  Jul 29, 1940  SUBJECTIVE:  CHIEF COMPLAINT:   Chief Complaint  Patient presents with  . Shortness of Breath     Has history of COPD and continued smoking on hospice at home and daughter with worsening respiratory failure, currently on BiPAP in ICU and still very agitated in spite of receiving IV morphine injections repeatedly. His daughter and wife are present in the room. Patient is agitated and not able to give any review of system.  REVIEW OF SYSTEMS:   Not able to give ROS. ROS  DRUG ALLERGIES:   Allergies  Allergen Reactions  . Ambien [Zolpidem] Nausea And Vomiting  . Codeine Nausea And Vomiting    VITALS:  Blood pressure 117/69, pulse 73, temperature 98.1 F (36.7 C), temperature source Axillary, resp. rate 20, height 5\' 10"  (1.778 m), weight 84.8 kg (187 lb), SpO2 97 %.  PHYSICAL EXAMINATION:  GENERAL:  77 y.o.-year-old patient lying in the bed with acute distress.  EYES: Pupils equal, round, reactive to light and accommodation. No scleral icterus. Extraocular muscles intact.  HEENT: Head atraumatic, normocephalic. Oropharynx and nasopharynx clear.  NECK:  Supple, no jugular venous distention. No thyroid enlargement, no tenderness.  LUNGS: Normal breath sounds bilaterally, wheezing, no crepitation.  use of accessory muscles of respiration. Use BIPAP CARDIOVASCULAR: S1, S2 normal. No murmurs, rubs, or gallops.  ABDOMEN: Soft, nontender, nondistended. Bowel sounds present. No organomegaly or mass.  EXTREMITIES: No pedal edema, cyanosis, or clubbing.  NEUROLOGIC: agitated and constantly moving all limbs in bed, disoriented.  PSYCHIATRIC: The patient is alert and oriented x 0.  SKIN: No obvious rash, lesion, or ulcer.   Physical Exam LABORATORY PANEL:   CBC Recent Labs  Lab 02/02/17 0418  WBC 5.5  HGB 10.4*  HCT 32.7*  PLT 202    ------------------------------------------------------------------------------------------------------------------  Chemistries  Recent Labs  Lab 02/02/17 0418  NA 136  K 5.1  CL 91*  CO2 37*  GLUCOSE 160*  BUN 26*  CREATININE 1.29*  CALCIUM 8.9   ------------------------------------------------------------------------------------------------------------------  Cardiac Enzymes Recent Labs  Lab 01/22/2017 2220 02/02/17 0418  TROPONINI 0.04* 0.03*   ------------------------------------------------------------------------------------------------------------------  RADIOLOGY:  Dg Chest Portable 1 View  Result Date: 01/12/2017 CLINICAL DATA:  Shortness of Breath EXAM: PORTABLE CHEST 1 VIEW COMPARISON:  October 22, 2016 FINDINGS: There is no edema or consolidation. Heart is slightly enlarged with pulmonary vascularity within normal limits. There is aortic atherosclerosis. No adenopathy. No bone lesions. IMPRESSION: Mild cardiomegaly. Aortic atherosclerosis. No edema or consolidation. Aortic Atherosclerosis (ICD10-I70.0). Electronically Signed   By: Bretta Bang III M.D.   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  2 acute on COPD exacerbation Plan of care as stated above  3 chronic diabetes mellitus type 2 Complete MAR when available, sliding scale insulin with Accu-Cheks per routine for now  4 chronic pain syndrome Stable  5. Agitation   Plan for IV morphin and precedex drip.    All the records are reviewed and case discussed with  Care  Management/Social Workerr. Management plans discussed with the patient, family and they are in agreement.  CODE STATUS: DNR  TOTAL TIME TAKING CARE OF THIS PATIENT: 35 minutes.   Discussed with daughter and wife in the room.  POSSIBLE D/C IN 1-2 DAYS, DEPENDING ON CLINICAL CONDITION.   Altamese DillingVaibhavkumar Lahela Woodin M.D on 02/02/2017   Between 7am to 6pm - Pager - [phone]  After 6pm go to [email or web address] - password [email or web address]  [facility] [place] [facility]  Office  [phone]  CC: Primary care physician; Danella PentonMiller, Mark F, MD  Note: This dictation was prepared with Dragon dictation along with smaller phrase technology. Any transcriptional errors that result from this process are unintentional.

## 2017-02-02 NOTE — Progress Notes (Signed)
[facility] patients continued agitation and complaints of SOB. Pt continues to say "I need to stand up", and rolls around in the bread RR in the 30's. Verbal orders to give 2 mg Versed and increase Precedex top range of 2.0. Orders read back and acknowledged. Will continue to assess.

## 2017-02-02 NOTE — Progress Notes (Signed)
2 mg Versed effective. Pt now resting comfortably on BiPAP. Dr.Kasa notified. New orders received for 2 mg Versed Q1 hr . Orders read back and acknowledged. Will continue to assess.

## 2017-02-02 DEATH — deceased

## 2017-02-03 DIAGNOSIS — Z515 Encounter for palliative care: Secondary | ICD-10-CM

## 2017-02-03 DIAGNOSIS — J9620 Acute and chronic respiratory failure, unspecified whether with hypoxia or hypercapnia: Secondary | ICD-10-CM

## 2017-02-03 DIAGNOSIS — J441 Chronic obstructive pulmonary disease with (acute) exacerbation: Secondary | ICD-10-CM

## 2017-02-03 DIAGNOSIS — R451 Restlessness and agitation: Secondary | ICD-10-CM

## 2017-02-03 LAB — GLUCOSE, CAPILLARY: GLUCOSE-CAPILLARY: 207 mg/dL — AB (ref 65–99)

## 2017-02-03 MED ORDER — LORAZEPAM 2 MG/ML IJ SOLN
1.0000 mg/h | INTRAVENOUS | Status: DC
  Administered 2017-02-03: 1 mg/h via INTRAVENOUS
  Administered 2017-02-03: 0.5 mg/h via INTRAVENOUS
  Filled 2017-02-03: qty 10

## 2017-02-03 MED ORDER — GLYCOPYRROLATE 0.2 MG/ML IJ SOLN: 0.2000 mg | INTRAMUSCULAR | Status: DC | PRN

## 2017-02-03 MED ORDER — LORAZEPAM 2 MG/ML IJ SOLN: 1.0000 mg | INTRAMUSCULAR | Status: DC | PRN

## 2017-02-03 MED ORDER — ONDANSETRON 4 MG PO TBDP
4.0000 mg | ORAL_TABLET | Freq: Four times a day (QID) | ORAL | Status: DC | PRN
  Filled 2017-02-03: qty 1

## 2017-02-03 MED ORDER — ACETAMINOPHEN 650 MG RE SUPP: 650.0000 mg | Freq: Four times a day (QID) | RECTAL | Status: DC | PRN

## 2017-02-03 MED ORDER — LORAZEPAM BOLUS VIA INFUSION
1.0000 mg | INTRAVENOUS | Status: DC | PRN
  Administered 2017-02-03: 1 mg via INTRAVENOUS
  Filled 2017-02-03: qty 2

## 2017-02-03 MED ORDER — HYDROMORPHONE BOLUS VIA INFUSION
1.0000 mg | INTRAVENOUS | Status: DC | PRN
  Administered 2017-02-03: 2 mg via INTRAVENOUS
  Filled 2017-02-03: qty 2

## 2017-02-03 MED ORDER — BUDESONIDE 0.5 MG/2ML IN SUSP: 0.5000 mg | Freq: Two times a day (BID) | RESPIRATORY_TRACT | Status: DC | PRN

## 2017-02-03 MED ORDER — HALOPERIDOL LACTATE 2 MG/ML PO CONC
0.5000 mg | ORAL | Status: DC | PRN
  Filled 2017-02-03: qty 0.3

## 2017-02-03 MED ORDER — HALOPERIDOL 0.5 MG PO TABS
0.5000 mg | ORAL_TABLET | ORAL | Status: DC | PRN
  Filled 2017-02-03: qty 1

## 2017-02-03 MED ORDER — BIOTENE DRY MOUTH MT LIQD: 15.0000 mL | OROMUCOSAL | Status: DC | PRN

## 2017-02-03 MED ORDER — POLYVINYL ALCOHOL 1.4 % OP SOLN
1.0000 [drp] | Freq: Four times a day (QID) | OPHTHALMIC | Status: DC | PRN
  Filled 2017-02-03: qty 15

## 2017-02-03 MED ORDER — ACETAMINOPHEN 325 MG PO TABS: 650.0000 mg | ORAL_TABLET | Freq: Four times a day (QID) | ORAL | Status: DC | PRN

## 2017-02-03 MED ORDER — ONDANSETRON HCL 4 MG/2ML IJ SOLN: 4.0000 mg | Freq: Four times a day (QID) | INTRAMUSCULAR | Status: DC | PRN

## 2017-02-03 MED ORDER — HALOPERIDOL LACTATE 5 MG/ML IJ SOLN: 0.5000 mg | INTRAMUSCULAR | Status: DC | PRN

## 2017-02-03 MED ORDER — SODIUM CHLORIDE 0.9 % IV SOLN
1.0000 mg/h | INTRAVENOUS | Status: DC
  Administered 2017-02-03: 0.5 mg/h via INTRAVENOUS
  Administered 2017-02-03: 2 mg/h via INTRAVENOUS
  Filled 2017-02-03: qty 5

## 2017-02-03 MED ORDER — IPRATROPIUM-ALBUTEROL 0.5-2.5 (3) MG/3ML IN SOLN: 3.0000 mL | RESPIRATORY_TRACT | Status: DC | PRN

## 2017-02-03 MED ORDER — GLYCOPYRROLATE 1 MG PO TABS: 1.0000 mg | ORAL_TABLET | ORAL | Status: DC | PRN

## 2017-02-04 ENCOUNTER — Telehealth: Payer: Self-pay | Admitting: Internal Medicine

## 2017-02-04 NOTE — Telephone Encounter (Signed)
Death cert placed in DK's folder to be signed. 

## 2017-02-04 NOTE — Telephone Encounter (Signed)
Recieved Death Certificate from __[employer]________ Delivered/Placed  In nurses bin ____________

## 2017-02-05 NOTE — Telephone Encounter (Signed)
[facility] informed death cert is ready for pick up.

## 2017-03-05 NOTE — Progress Notes (Signed)
Nutrition Brief Note  Chart reviewed. Pt now transitioning to comfort care. Waiting to remove BiPaP currently No further nutrition interventions warranted at this time.  Please re-consult as needed.   Dionne AnoWilliam M. Aireonna Bauer, MS, RD LDN Inpatient Clinical Dietitian Pager [phone]

## 2017-03-05 NOTE — Progress Notes (Addendum)
No charge note.  Called to bedside by family.  Approximately 12 people were in the room including wife, children, in-laws and grand children.  All very tearful.  Per wife they accept that he will not recover.  She is unable to make the decision to take off the Bipap mask and asked that I make the decision.  I told her that I would but actually his body has already made the decision for us - He is dying.  The Chaplain and I answered questions.  I explained that the dying process may be minutes or it may be days - we are uncertain.  Chaplain offered prayer which was appreciated by the family.  After leaving the family I spoke with the RN and adjusted orders to remove Bipap and increase comfort mediations.  Plan for full comfort.  Removal of Bipap this afternoon after several boluses are given.  Norvel RichardsMarianne Tvisha Schwoerer, PA-C Palliative Medicine Pager: [phone]

## 2017-03-05 NOTE — Progress Notes (Signed)
CH responded to a PG. Family has decided to proceed with comfort care. Palliative Care explained the process and was open to questions. CH offered a prayer of thanksgiving and for peace. CH is available for follow up as needed.    July 20, 2017 1300  Clinical Encounter Type  Visited With Patient;Patient and family together;Health care provider  Visit Type Initial;Spiritual support;Critical Care;Patient actively dying  Referral From Nurse;Palliative care team  Consult/Referral To Chaplain  Spiritual Encounters  Spiritual Needs Prayer;Emotional

## 2017-03-05 NOTE — Progress Notes (Signed)
CH responded to an OR for prayer. Pt is on a ventilator and is unable to respond. Wife is bedside. Wife is having a difficult time letting go of her husband of 56 years. Wife understands Pt's situation is not good. Wife wants to wait for some grandchildren to arrive, then possibly have a family discussion. CH provided a prayer for comfort and wisdom. CH will monitor.     May 13, 2017 1300  Clinical Encounter Type  Visited With Patient;Patient and family together;Health care provider  Visit Type Initial;Spiritual support;Critical Care;Patient actively dying  Referral From Nurse;Palliative care team  Consult/Referral To Chaplain  Spiritual Encounters  Spiritual Needs Prayer;Emotional

## 2017-03-05 NOTE — Death Summary Note (Signed)
  DEATH SUMMARY   Patient Details  Name: Jeremy PlanasJackie N Shimon Sr. MRN: [id number] DOB: 02-20-1940  Admission/Discharge Information   Admit Date:  May 11, 2016  Date of Death: Date of Death: 02/18/2017  Time of Death: Time of Death: 1635  Length of Stay: 2  Referring Physician: Danella PentonMiller, Mark F, MD   Reason(s) for Hospitalization  COPD EXACERBATION  Diagnoses  Preliminary cause of death:  Secondary Diagnoses (including complications and co-morbidities):  Active Problems:   COPD (chronic obstructive pulmonary disease) (HCC)   Acute and chr resp failure, unsp w hypoxia or hypercapnia (HCC)   Agitation requiring sedation protocol   Palliative care encounter   Terminal care   Comfort measures only status   Brief Hospital Course (including significant findings, care, treatment, and services provided and events leading to death)  Patient was made DNR/DNI And he failed biPAP and patient was made comfort care    Pertinent Labs and Studies  Significant Diagnostic Studies Dg Chest Portable 1 View  Result Date: May 11, 2016 CLINICAL DATA:  Shortness of Breath EXAM: PORTABLE CHEST 1 VIEW COMPARISON:  October 22, 2016 FINDINGS: There is no edema or consolidation. Heart is slightly enlarged with pulmonary vascularity within normal limits. There is aortic atherosclerosis. No adenopathy. No bone lesions. IMPRESSION: Mild cardiomegaly. Aortic atherosclerosis. No edema or consolidation. Aortic Atherosclerosis (ICD10-I70.0). Electronically Signed   By: Bretta BangWilliam  Woodruff III M.D.   On: May 11, 2016 12:54    Microbiology Recent Results (from the past 240 hour(s))  MRSA PCR Screening     Status: None   Collection Time: 05-29-2016  4:52 PM  Result Value Ref Range Status   MRSA by PCR NEGATIVE NEGATIVE Final    Comment:        The GeneXpert MRSA Assay (FDA approved for NASAL specimens only), is one component of a comprehensive MRSA colonization surveillance program. It is not intended to diagnose  MRSA infection nor to guide or monitor treatment for MRSA infections. Performed at [facility], [address] [place], [place] [address]     Lab Basic Metabolic Panel: No results for input(s): NA, K, CL, CO2, GLUCOSE, BUN, CREATININE, CALCIUM, MG, PHOS in the last 168 hours. Liver Function Tests: No results for input(s): AST, ALT, ALKPHOS, BILITOT, PROT, ALBUMIN in the last 168 hours. No results for input(s): LIPASE, AMYLASE in the last 168 hours. No results for input(s): AMMONIA in the last 168 hours. CBC: No results for input(s): WBC, NEUTROABS, HGB, HCT, MCV, PLT in the last 168 hours. Cardiac Enzymes: No results for input(s): CKTOTAL, CKMB, CKMBINDEX, TROPONINI in the last 168 hours. Sepsis Labs: No results for input(s): PROCALCITON, WBC, LATICACIDVEN in the last 168 hours.    Jeremy Wells 02/10/2017, 8:00 PM

## 2017-03-05 NOTE — Progress Notes (Signed)
[employer] - [place] at [facility]   PATIENT NAME: Jeremy SaltJackie Wells    MR#:  [id number]  DATE OF BIRTH:  [birth year]  SUBJECTIVE:  CHIEF COMPLAINT:   Chief Complaint  Patient presents with  . Shortness of Breath     Has history of COPD and continued smoking on hospice at home and daughter brought him with worsening respiratory status, currently on BiPAP in ICU and clinically not improving Several family members in the room including wife daughter granddaughter and significant others leaning towards comfort care as patient is clinically not improving  REVIEW OF SYSTEMS:   Not able to give ROS. ROS  DRUG ALLERGIES:   Allergies  Allergen Reactions  . Ambien [Zolpidem] Nausea And Vomiting  . Codeine Nausea And Vomiting    VITALS:  Blood pressure (!) 158/82, pulse (!) 56, temperature (!) 97.5 F (36.4 C), temperature source Axillary, resp. rate 19, height 5\' 10"  (1.778 m), weight 84.8 kg (187 lb), SpO2 97 %.  PHYSICAL EXAMINATION:  GENERAL:  77 y.o.-year-old patient lying in the bed with acute distress.  EYES: Pupils equal, round, reactive to light and accommodation. No scleral icterus. Extraocular muscles intact.  HEENT: Head atraumatic, normocephalic. Oropharynx and nasopharynx clear.  NECK:  Supple, no jugular venous distention. No thyroid enlargement, no tenderness.  LUNGS: Diminished breath sounds bilaterally, on BiPAP , no wheezing, no crepitation. no  use of accessory muscles of respiration.  CARDIOVASCULAR: S1, S2 normal. No murmurs, rubs, or gallops.  ABDOMEN: Soft, nontender, nondistended. Bowel sounds present.   EXTREMITIES: No pedal edema, cyanosis, or clubbing.  NEUROLOGIC: Disoriented PSYCHIATRIC: The patient is alert and oriented x 0.  SKIN: No obvious rash, lesion, or ulcer.   Physical Exam LABORATORY PANEL:   CBC Recent Labs  Lab 02/02/17 0418  WBC 5.5  HGB 10.4*  HCT 32.7*  PLT 202    ------------------------------------------------------------------------------------------------------------------  Chemistries  Recent Labs  Lab 02/02/17 0418  NA 136  K 5.1  CL 91*  CO2 37*  GLUCOSE 160*  BUN 26*  CREATININE 1.29*  CALCIUM 8.9   ------------------------------------------------------------------------------------------------------------------  Cardiac Enzymes Recent Labs  Lab Jun 24, 2016 2220 02/02/17 0418  TROPONINI 0.04* 0.03*   ------------------------------------------------------------------------------------------------------------------  RADIOLOGY:  No results found.  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 Agitation IV morphine as needed  After discussing with the palliative care family is going to change patient's CODE STATUS to DO NOT RESUSCITATE with comfort care    All the records are reviewed and case discussed with Care Management/Social Workerr. Management plans discussed with the patient's  family and they are in agreement.  CODE STATUS: DNR  TOTAL TIME TAKING CARE OF THIS PATIENT: 35 minutes.   Discussed with daughter and wife in  the room.  POSSIBLE  D/C IN 1-2 DAYS, DEPENDING ON CLINICAL CONDITION.   Ramonita Lab M.D on 03/03/2017   Between 7am to 6pm - Pager - [phone]  After 6pm go to [email or web address] - password [email or web address]  [facility] [place] [facility]  Office  [phone]  CC: Primary care physician; Danella Penton, MD  Note: This dictation was prepared with Dragon dictation along with smaller phrase technology. Any transcriptional errors that result from this process are unintentional.

## 2017-03-05 NOTE — Progress Notes (Signed)
Pt has been transitioned to comfort care. Family at bedside.

## 2017-03-05 NOTE — Consult Note (Signed)
Consultation Note Date: 2017/02/06   Patient Name: Jeremy GARRETTE Sr.  DOB: [birth year]  MRN: [id number]  Age / Sex: 77 y.o., male  PCP: Rusty Aus, MD Referring Physician: Nicholes Mango, MD  Reason for Consultation: Establishing goals of care, Non pain symptom management, Pain control, Psychosocial/spiritual support and Terminal Care  HPI/Patient Profile: 77 y.o. male  with past medical history of end stage COPD on 4L of oxygen at home, chronic pain with restless leg (on MS contin 90 mg daily at home), and atrial fibrillation who was admitted on 01/29/2017 with SOB.  He was found to have hypercarbic respiratory failure secondary to severe COPD exacerbation.  He has received aggressive medical treatment since admission with no improvement.  He is requiring Bipap.  He has repeatedly been very assertive about taking off the Bipap and requesting that it not be put back on.  He has been quite agitated requiring Precedex gtt.  His family is insisting Bipap be continued.  Clinical Assessment and Goals of Care:  I have reviewed medical records including EPIC notes, labs and imaging, received report from the bedside RN and attending CCM physician, assessed the patient and then met at the bedside along with his wife, 2 daughters and son to discuss diagnosis prognosis, [place], EOL wishes, disposition and options.  I introduced Palliative Medicine as specialized medical care for people living with serious illness. It focuses on providing relief from the symptoms and stress of a serious illness. The goal is to improve quality of life for both the patient and the family.  We discussed a brief life review of the patient. 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  He hunted deer as well as bear, and raised many animals on his property.  He is a spiritual man, [place].  He told his family just prior to  admission that he was ready to die.  As far as functional and nutritional status he was able to walk only very short distances due to SOB.  He was eating and drinking fairly well.  We discussed his current illness and what it means in the larger context of his on-going co-morbidities.  Natural disease trajectory and expectations at EOL were discussed.  Family understands he is dying.  They are very reluctant to let him go. They also understand that he does not want the Bipap.  The family stated that they were unable to make the decision to take the Bipap off.  I explained that due to the limitations of medical science we are unable to improve his current status.  Awake he is agitated, SOB and very uncomfortable.  He has end stage respiratory failure.  At this point using Bipap is prolonging the uncomfortable process of dying.  There is no decision that the family has to make.  Bipap is contra indicated at this time.  We agreed to leave the Bipap in place for a period of time (a couple of days) for the family to process his pending death.  I discussed with them a progression into full comfort except for Bipap support.   Primary Decision Maker:  NEXT OF KIN wife.  (patient is sedated)    SUMMARY OF RECOMMENDATIONS    Full comfort care, but will leave Bipap in place for 48 hours.  Will d/c Bipap sooner if family would like to do so.  D/C all interventions no related to comfort.  Put comfort medications in place including dilaudid gtt and ativan gtt.  (Patient is a chronic MS contin patient who has required versed, precedex gtt, morphine in the ICU).  Code Status/Advance Care Planning:  DNR/DNI   Psycho-social/Spiritual:   Desire for further Chaplaincy support: Requested.  Prognosis:   Hours to days due to end stage respiratory failure.  Unable to support effective respiration without Bipap.  Discharge Planning: Anticipated Hospital Death        Primary Diagnoses: Present on  Admission: . COPD (chronic obstructive pulmonary disease) ([place])   I have reviewed the medical record, interviewed the patient and family, and examined the patient. The following aspects are pertinent.  Past Medical History:  Diagnosis Date  . Chronic pain   . COPD (chronic obstructive pulmonary disease) (HCC)    on 4L home oxygen  . Diabetes mellitus without complication ([place])   . 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 Marital status: Married    Spouse name: None  . Number of children: None  . Years of education: None  . Highest education level: None  Social Needs  . Financial resource strain: None  . Food insecurity - worry: None  . Food insecurity - inability: None  . Transportation needs - medical: None  . Transportation needs - non-medical: None  Occupational History  . None  Tobacco Use  . Smoking status: Current Every Day Smoker    Packs/day: 0.50    Types: Cigarettes  . Smokeless tobacco: Never Used  Substance and Sexual Activity  . Alcohol use: No  . Drug use: No  . Sexual activity: None  Other Topics Concern  . None  Social History Narrative   Lives at home with family. Ambulates with a cane.   Family History  Problem Relation Age of Onset  . CVA Father    Scheduled Meds: . chlorhexidine  15 mL Mouth Rinse BID   Continuous Infusions: . azithromycin Stopped (02/02/17 1213)  . HYDROmorphone    . LORazepam (ATIVAN) infusion     PRN Meds:.acetaminophen **OR** acetaminophen, acetaminophen **OR** acetaminophen, antiseptic oral rinse, budesonide (PULMICORT) nebulizer solution, glycopyrrolate **OR** glycopyrrolate **OR** glycopyrrolate, haloperidol **OR** haloperidol **OR** haloperidol lactate, HYDROmorphone, ipratropium-albuterol, LORazepam, ondansetron **OR** ondansetron (ZOFRAN) IV, ondansetron **OR** ondansetron (ZOFRAN) IV, polyvinyl alcohol Allergies  Allergen Reactions  . Ambien [Zolpidem] Nausea And Vomiting  . Codeine  Nausea And Vomiting   Review of Systems patient sleeping on bipap  Physical Exam  Well developed elderly gentleman, sedated on Bipap. CV brady, reg resp on bipap, severely decreased air movement in his lungs Abdomen soft, nt, nd Extremities small continuous movements in his legs, no edema.  Vital Signs: BP (!) 158/82   Pulse (!) 56   Temp (!) 97.5 F (36.4 C) (Axillary)   Resp (!) 21   Ht '5\' 10"'  (1.778 m)   Wt 84.8 kg (187 lb)   SpO2 98%   BMI 26.83 kg/m  Pain Assessment: CPOT   Pain Score: 1    SpO2: SpO2: 98 % O2 Device:SpO2: 98 % O2 Flow Rate: .O2  Flow Rate (L/min): 2 L/min  IO: Intake/output summary:   Intake/Output Summary (Last 24 hours) at 25-Feb-2017 0942 Last data filed at 02/25/2017 0800 Gross per 24 hour  Intake 758.91 ml  Output 200 ml  Net 558.91 ml    LBM: Last BM Date: (UTA) Baseline Weight: Weight: 84.8 kg (187 lb) Most recent weight: Weight: 84.8 kg (187 lb)     Palliative Assessment/Data: 10%     Time In: 8:20 Time Out: 10:00 Time Total: 100 min. Greater than 50%  of this time was spent counseling and coordinating care related to the above assessment and plan.  Signed by: Florentina Jenny, PA-C Palliative Medicine Pager: [phone]  Please contact Palliative Medicine Team phone at [phone] for questions and concerns.  For individual provider: See Shea Evans

## 2017-03-05 DEATH — deceased
